# Patient Record
Sex: Male | Born: 1960 | Race: White | Hispanic: No | Marital: Married | Smoking: Never smoker
Health system: Southern US, Community
[De-identification: ages and names within clinical notes are randomized; demographics above are authoritative.]

## PROBLEM LIST (undated history)

## (undated) DIAGNOSIS — I10 Essential (primary) hypertension: Secondary | ICD-10-CM

## (undated) DIAGNOSIS — E119 Type 2 diabetes mellitus without complications: Secondary | ICD-10-CM

---

## 2009-11-09 ENCOUNTER — Emergency Department (HOSPITAL_COMMUNITY): Admission: EM | Admit: 2009-11-09 | Discharge: 2009-11-09 | Payer: Self-pay | Admitting: Emergency Medicine

## 2011-02-28 LAB — CBC
HCT: 48.7 % (ref 39.0–52.0)
Hemoglobin: 16.7 g/dL (ref 13.0–17.0)
MCHC: 34.3 g/dL (ref 30.0–36.0)
RDW: 12.5 % (ref 11.5–15.5)

## 2011-02-28 LAB — DIFFERENTIAL
Lymphs Abs: 0.3 10*3/uL — ABNORMAL LOW (ref 0.7–4.0)
Monocytes Relative: 4 % (ref 3–12)
Neutro Abs: 10.9 10*3/uL — ABNORMAL HIGH (ref 1.7–7.7)
Neutrophils Relative %: 93 % — ABNORMAL HIGH (ref 43–77)

## 2011-02-28 LAB — COMPREHENSIVE METABOLIC PANEL
Albumin: 4.2 g/dL (ref 3.5–5.2)
BUN: 21 mg/dL (ref 6–23)
Creatinine, Ser: 1.24 mg/dL (ref 0.4–1.5)
Potassium: 4.3 mEq/L (ref 3.5–5.1)
Total Protein: 7.6 g/dL (ref 6.0–8.3)

## 2011-02-28 LAB — POCT CARDIAC MARKERS
CKMB, poc: <1 ng/mL — ABNORMAL LOW (ref 1.0–8.0)
CKMB, poc: <1 ng/mL — ABNORMAL LOW (ref 1.0–8.0)
Myoglobin, poc: 129 ng/mL (ref 12–200)
Troponin i, poc: <0.05 ng/mL (ref 0.00–0.09)

## 2020-07-07 ENCOUNTER — Emergency Department (HOSPITAL_BASED_OUTPATIENT_CLINIC_OR_DEPARTMENT_OTHER): Payer: 59

## 2020-07-07 ENCOUNTER — Other Ambulatory Visit: Payer: Self-pay

## 2020-07-07 ENCOUNTER — Encounter (HOSPITAL_BASED_OUTPATIENT_CLINIC_OR_DEPARTMENT_OTHER): Payer: Self-pay

## 2020-07-07 ENCOUNTER — Inpatient Hospital Stay (HOSPITAL_BASED_OUTPATIENT_CLINIC_OR_DEPARTMENT_OTHER)
Admission: EM | Admit: 2020-07-07 | Discharge: 2020-07-08 | DRG: 871 | Disposition: A | Discharge status: Home / Self Care | Payer: 59 | Attending: Internal Medicine | Admitting: Internal Medicine

## 2020-07-07 DIAGNOSIS — R0902 Hypoxemia: Secondary | ICD-10-CM | POA: Diagnosis present

## 2020-07-07 DIAGNOSIS — J9601 Acute respiratory failure with hypoxia: Secondary | ICD-10-CM | POA: Diagnosis present

## 2020-07-07 DIAGNOSIS — I959 Hypotension, unspecified: Secondary | ICD-10-CM | POA: Diagnosis present

## 2020-07-07 DIAGNOSIS — R55 Syncope and collapse: Secondary | ICD-10-CM

## 2020-07-07 DIAGNOSIS — E119 Type 2 diabetes mellitus without complications: Secondary | ICD-10-CM | POA: Diagnosis present

## 2020-07-07 DIAGNOSIS — U071 COVID-19: Secondary | ICD-10-CM | POA: Diagnosis present

## 2020-07-07 DIAGNOSIS — E785 Hyperlipidemia, unspecified: Secondary | ICD-10-CM | POA: Diagnosis present

## 2020-07-07 DIAGNOSIS — Z79899 Other long term (current) drug therapy: Secondary | ICD-10-CM | POA: Diagnosis not present

## 2020-07-07 DIAGNOSIS — I10 Essential (primary) hypertension: Secondary | ICD-10-CM

## 2020-07-07 DIAGNOSIS — A4189 Other specified sepsis: Secondary | ICD-10-CM | POA: Diagnosis present

## 2020-07-07 DIAGNOSIS — J1282 Pneumonia due to coronavirus disease 2019: Secondary | ICD-10-CM | POA: Diagnosis present

## 2020-07-07 DIAGNOSIS — Z7982 Long term (current) use of aspirin: Secondary | ICD-10-CM

## 2020-07-07 HISTORY — DX: Type 2 diabetes mellitus without complications: E11.9

## 2020-07-07 HISTORY — DX: Essential (primary) hypertension: I10

## 2020-07-07 LAB — CBC WITH DIFFERENTIAL/PLATELET
Abs Immature Granulocytes: 0.01 10*3/uL (ref 0.00–0.07)
Basophils Absolute: 0 10*3/uL (ref 0.0–0.1)
Basophils Relative: 0 %
Eosinophils Absolute: 0 10*3/uL (ref 0.0–0.5)
Eosinophils Relative: 0 %
HCT: 46.5 % (ref 39.0–52.0)
Hemoglobin: 15.1 g/dL (ref 13.0–17.0)
Immature Granulocytes: 0 %
Lymphocytes Relative: 12 %
Lymphs Abs: 0.5 10*3/uL — ABNORMAL LOW (ref 0.7–4.0)
MCH: 29.8 pg (ref 26.0–34.0)
MCHC: 32.5 g/dL (ref 30.0–36.0)
MCV: 91.9 fL (ref 80.0–100.0)
Monocytes Absolute: 0.3 10*3/uL (ref 0.1–1.0)
Monocytes Relative: 7 %
Neutro Abs: 3.4 10*3/uL (ref 1.7–7.7)
Neutrophils Relative %: 81 %
Platelets: 136 10*3/uL — ABNORMAL LOW (ref 150–400)
RBC: 5.06 MIL/uL (ref 4.22–5.81)
RDW: 12.3 % (ref 11.5–15.5)
WBC: 4.2 10*3/uL (ref 4.0–10.5)
nRBC: 0 % (ref 0.0–0.2)

## 2020-07-07 LAB — COMPREHENSIVE METABOLIC PANEL
ALT: 22 U/L (ref 0–44)
AST: 40 U/L (ref 15–41)
Albumin: 3.4 g/dL — ABNORMAL LOW (ref 3.5–5.0)
Alkaline Phosphatase: 72 U/L (ref 38–126)
Anion gap: 12 (ref 5–15)
BUN: 16 mg/dL (ref 6–20)
CO2: 26 mmol/L (ref 22–32)
Calcium: 7.9 mg/dL — ABNORMAL LOW (ref 8.9–10.3)
Chloride: 97 mmol/L — ABNORMAL LOW (ref 98–111)
Creatinine, Ser: 1.21 mg/dL (ref 0.61–1.24)
GFR calc Af Amer: >60 mL/min (ref >60)
GFR calc non Af Amer: >60 mL/min (ref >60)
Glucose, Bld: 188 mg/dL — ABNORMAL HIGH (ref 70–99)
Potassium: 4 mmol/L (ref 3.5–5.1)
Sodium: 135 mmol/L (ref 135–145)
Total Bilirubin: 0.4 mg/dL (ref 0.3–1.2)
Total Protein: 6.7 g/dL (ref 6.5–8.1)

## 2020-07-07 LAB — GLUCOSE, CAPILLARY: Glucose-Capillary: 196 mg/dL — ABNORMAL HIGH (ref 70–99)

## 2020-07-07 LAB — PROTIME-INR
INR: 1 (ref 0.8–1.2)
Prothrombin Time: 12.3 seconds (ref 11.4–15.2)

## 2020-07-07 LAB — LACTIC ACID, PLASMA
Lactic Acid, Venous: 2.1 mmol/L (ref 0.5–1.9)
Lactic Acid, Venous: 2 mmol/L (ref 0.5–1.9)

## 2020-07-07 LAB — CREATININE, SERUM
Creatinine, Ser: 1.17 mg/dL (ref 0.61–1.24)
GFR calc Af Amer: >60 mL/min (ref >60)
GFR calc non Af Amer: >60 mL/min (ref >60)

## 2020-07-07 LAB — D-DIMER, QUANTITATIVE: D-Dimer, Quant: 0.79 ug/mL-FEU — ABNORMAL HIGH (ref 0.00–0.50)

## 2020-07-07 LAB — TRIGLYCERIDES: Triglycerides: 123 mg/dL (ref <150)

## 2020-07-07 LAB — SARS CORONAVIRUS 2 BY RT PCR (HOSPITAL ORDER, PERFORMED IN ~~LOC~~ HOSPITAL LAB): SARS Coronavirus 2: POSITIVE — AB

## 2020-07-07 LAB — PROCALCITONIN: Procalcitonin: <0.1 ng/mL

## 2020-07-07 LAB — TROPONIN I (HIGH SENSITIVITY): Troponin I (High Sensitivity): 9 ng/L (ref <18)

## 2020-07-07 LAB — FIBRINOGEN: Fibrinogen: 671 mg/dL — ABNORMAL HIGH (ref 210–475)

## 2020-07-07 LAB — APTT: aPTT: 28 seconds (ref 24–36)

## 2020-07-07 LAB — FERRITIN: Ferritin: 717 ng/mL — ABNORMAL HIGH (ref 24–336)

## 2020-07-07 LAB — C-REACTIVE PROTEIN: CRP: 5.5 mg/dL — ABNORMAL HIGH (ref <1.0)

## 2020-07-07 LAB — LACTATE DEHYDROGENASE: LDH: 322 U/L — ABNORMAL HIGH (ref 98–192)

## 2020-07-07 MED ORDER — VANCOMYCIN HCL IN DEXTROSE 1-5 GM/200ML-% IV SOLN
1000.0000 mg | Freq: Once | INTRAVENOUS | Status: AC
  Administered 2020-07-07: 1000 mg via INTRAVENOUS
  Filled 2020-07-07: qty 200

## 2020-07-07 MED ORDER — INSULIN ASPART 100 UNIT/ML ~~LOC~~ SOLN
0.0000 [IU] | Freq: Three times a day (TID) | SUBCUTANEOUS | Status: DC
  Administered 2020-07-08: 5 [IU] via SUBCUTANEOUS

## 2020-07-07 MED ORDER — ALBUTEROL SULFATE HFA 108 (90 BASE) MCG/ACT IN AERS
INHALATION_SPRAY | RESPIRATORY_TRACT | Status: AC
  Filled 2020-07-07: qty 6.7

## 2020-07-07 MED ORDER — SODIUM CHLORIDE 0.9 % IV SOLN
100.0000 mg | Freq: Once | INTRAVENOUS | Status: AC
  Administered 2020-07-07: 100 mg via INTRAVENOUS
  Filled 2020-07-07: qty 20

## 2020-07-07 MED ORDER — ACETAMINOPHEN 500 MG PO TABS
ORAL_TABLET | ORAL | Status: AC
  Administered 2020-07-07: 1000 mg via ORAL
  Filled 2020-07-07: qty 2

## 2020-07-07 MED ORDER — LACTATED RINGERS IV SOLN: INTRAVENOUS | Status: AC

## 2020-07-07 MED ORDER — SODIUM CHLORIDE 0.9 % IV SOLN: INTRAVENOUS | Status: DC | PRN

## 2020-07-07 MED ORDER — PANTOPRAZOLE SODIUM 40 MG IV SOLR
40.0000 mg | Freq: Every day | INTRAVENOUS | Status: DC
  Administered 2020-07-07: 40 mg via INTRAVENOUS
  Filled 2020-07-07: qty 40

## 2020-07-07 MED ORDER — INSULIN ASPART 100 UNIT/ML ~~LOC~~ SOLN: 0.0000 [IU] | Freq: Every day | SUBCUTANEOUS | Status: DC

## 2020-07-07 MED ORDER — ONDANSETRON HCL 4 MG/2ML IJ SOLN: 4.0000 mg | Freq: Four times a day (QID) | INTRAMUSCULAR | Status: DC | PRN

## 2020-07-07 MED ORDER — DEXAMETHASONE SODIUM PHOSPHATE 10 MG/ML IJ SOLN: 6.0000 mg | INTRAMUSCULAR | Status: DC

## 2020-07-07 MED ORDER — DEXAMETHASONE SODIUM PHOSPHATE 10 MG/ML IJ SOLN
6.0000 mg | Freq: Once | INTRAMUSCULAR | Status: AC
  Administered 2020-07-07: 6 mg via INTRAVENOUS
  Filled 2020-07-07: qty 1

## 2020-07-07 MED ORDER — IOHEXOL 350 MG/ML SOLN
100.0000 mL | Freq: Once | INTRAVENOUS | Status: AC | PRN
  Administered 2020-07-07: 100 mL via INTRAVENOUS

## 2020-07-07 MED ORDER — ACETAMINOPHEN 325 MG PO TABS: 650.0000 mg | ORAL_TABLET | Freq: Four times a day (QID) | ORAL | Status: DC | PRN

## 2020-07-07 MED ORDER — SODIUM CHLORIDE 0.9 % IV BOLUS
1000.0000 mL | Freq: Once | INTRAVENOUS | Status: AC
  Administered 2020-07-07: 1000 mL via INTRAVENOUS

## 2020-07-07 MED ORDER — ENOXAPARIN SODIUM 40 MG/0.4ML ~~LOC~~ SOLN
40.0000 mg | Freq: Every day | SUBCUTANEOUS | Status: DC
  Administered 2020-07-07: 40 mg via SUBCUTANEOUS
  Filled 2020-07-07: qty 0.4

## 2020-07-07 MED ORDER — METRONIDAZOLE IN NACL 5-0.79 MG/ML-% IV SOLN
500.0000 mg | Freq: Once | INTRAVENOUS | Status: AC
  Administered 2020-07-07: 500 mg via INTRAVENOUS
  Filled 2020-07-07: qty 100

## 2020-07-07 MED ORDER — SODIUM CHLORIDE 0.9 % IV SOLN
100.0000 mg | Freq: Every day | INTRAVENOUS | Status: DC
  Administered 2020-07-08: 100 mg via INTRAVENOUS
  Filled 2020-07-07: qty 20

## 2020-07-07 MED ORDER — SODIUM CHLORIDE 0.9 % IV SOLN
2.0000 g | Freq: Once | INTRAVENOUS | Status: AC
  Administered 2020-07-07: 2 g via INTRAVENOUS
  Filled 2020-07-07: qty 2

## 2020-07-07 MED ORDER — VANCOMYCIN HCL IN DEXTROSE 1-5 GM/200ML-% IV SOLN: 1000.0000 mg | Freq: Once | INTRAVENOUS | Status: DC

## 2020-07-07 MED ORDER — ONDANSETRON HCL 4 MG PO TABS: 4.0000 mg | ORAL_TABLET | Freq: Four times a day (QID) | ORAL | Status: DC | PRN

## 2020-07-07 MED ORDER — ACETAMINOPHEN 500 MG PO TABS: 1000.0000 mg | ORAL_TABLET | Freq: Once | ORAL | Status: AC

## 2020-07-07 NOTE — ED Provider Notes (Signed)
[facility] EMERGENCY DEPARTMENT Provider Note   CSN: [id number] Arrival date & time: 07/07/20  1531     History Chief Complaint  Patient presents with  . Loss of Consciousness    Jake Mosley is a 59 y.o. male.  The history is provided by the patient and medical records. The history is limited by the condition of the patient. No language interpreter was used.  Cough Cough characteristics:  Non-productive Severity:  Moderate Onset quality:  Gradual Duration:  1 week Timing:  Constant Progression:  Waxing and waning Chronicity:  New Relieved by:  Nothing Worsened by:  Nothing Ineffective treatments:  None tried Associated symptoms: chills, fever, myalgias and shortness of breath   Associated symptoms: no chest pain, no diaphoresis, no headaches, no rash, no rhinorrhea, no sore throat and no wheezing        Past Medical History:  Diagnosis Date  . Diabetes mellitus without complication (HCC)   . Hypertension     There are no problems to display for this patient.   History reviewed. No pertinent surgical history.     No family history on file.  Social History   Tobacco Use  . Smoking status: Never Smoker  . Smokeless tobacco: Never Used  Vaping Use  . Vaping Use: Never used  Substance Use Topics  . Alcohol use: Never  . Drug use: Never    Home Medications Prior to Admission medications   Not on File    Allergies    Patient has no known allergies.  Review of Systems   Review of Systems  Constitutional: Positive for chills, fatigue and fever. Negative for diaphoresis.  HENT: Negative for congestion, rhinorrhea and sore throat.   Eyes: Negative for photophobia.  Respiratory: Positive for cough, chest tightness and shortness of breath. Negative for wheezing.   Cardiovascular: Negative for chest pain, palpitations and leg swelling.  Gastrointestinal: Negative for abdominal pain, constipation, diarrhea, nausea and vomiting.    Genitourinary: Negative for flank pain.  Musculoskeletal: Positive for myalgias. Negative for back pain, neck pain and neck stiffness.  Skin: Negative for rash and wound.  Neurological: Positive for syncope and light-headedness. Negative for dizziness, weakness and headaches.  Psychiatric/Behavioral: Negative for agitation and confusion.  All other systems reviewed and are negative.   Physical Exam Updated Vital Signs BP (!) 85/61   Pulse 77   Temp (!) 100.5 F (38.1 C) (Oral)   Resp 14   SpO2 (!) 84%   Physical Exam Vitals and nursing note reviewed.  Constitutional:      General: He is not in acute distress.    Appearance: He is well-developed. He is ill-appearing. He is not toxic-appearing or diaphoretic.  HENT:     Head: Normocephalic and atraumatic.     Nose: Nose normal. No congestion or rhinorrhea.     Mouth/Throat:     Mouth: Mucous membranes are dry.     Pharynx: No oropharyngeal exudate or posterior oropharyngeal erythema.  Eyes:     Extraocular Movements: Extraocular movements intact.     Conjunctiva/sclera: Conjunctivae normal.     Pupils: Pupils are equal, round, and reactive to light.  Cardiovascular:     Rate and Rhythm: Normal rate and regular rhythm.     Pulses: Normal pulses.     Heart sounds: No murmur heard.   Pulmonary:     Effort: Pulmonary effort is normal. No respiratory distress.     Breath sounds: Rhonchi present. No wheezing or rales.  Chest:  Chest wall: No tenderness.  Abdominal:     General: Abdomen is flat.     Palpations: Abdomen is soft.     Tenderness: There is no abdominal tenderness. There is no right CVA tenderness, left CVA tenderness or guarding.     Hernia: No hernia is present.  Musculoskeletal:        General: No tenderness.     Cervical back: Neck supple. No tenderness.     Right lower leg: No edema.     Left lower leg: No edema.  Skin:    General: Skin is warm and dry.     Capillary Refill: Capillary refill takes  less than 2 seconds.     Coloration: Skin is not pale.     Findings: No erythema or rash.  Neurological:     General: No focal deficit present.     Mental Status: He is alert and oriented to person, place, and time.     Sensory: No sensory deficit.     Motor: No weakness.  Psychiatric:        Mood and Affect: Mood normal.     ED Results / Procedures / Treatments   Labs (all labs ordered are listed, but only abnormal results are displayed) Labs Reviewed  SARS CORONAVIRUS 2 BY RT PCR (HOSPITAL ORDER, PERFORMED IN [facility]) - Abnormal; Notable for the following components:      Result Value   SARS Coronavirus 2 POSITIVE (*)    All other components within normal limits  LACTIC ACID, PLASMA - Abnormal; Notable for the following components:   Lactic Acid, Venous 2.1 (*)    All other components within normal limits  COMPREHENSIVE METABOLIC PANEL - Abnormal; Notable for the following components:   Chloride 97 (*)    Glucose, Bld 188 (*)    Calcium 7.9 (*)    Albumin 3.4 (*)    All other components within normal limits  CBC WITH DIFFERENTIAL/PLATELET - Abnormal; Notable for the following components:   Platelets 136 (*)    Lymphs Abs 0.5 (*)    All other components within normal limits  LACTIC ACID, PLASMA - Abnormal; Notable for the following components:   Lactic Acid, Venous 2.0 (*)    All other components within normal limits  D-DIMER, QUANTITATIVE (NOT AT [facility]) - Abnormal; Notable for the following components:   D-Dimer, Quant 0.79 (*)    All other components within normal limits  LACTATE DEHYDROGENASE - Abnormal; Notable for the following components:   LDH 322 (*)    All other components within normal limits  FERRITIN - Abnormal; Notable for the following components:   Ferritin 717 (*)    All other components within normal limits  FIBRINOGEN - Abnormal; Notable for the following components:   Fibrinogen 671 (*)    All other components within normal limits    C-REACTIVE PROTEIN - Abnormal; Notable for the following components:   CRP 5.5 (*)    All other components within normal limits  GLUCOSE, CAPILLARY - Abnormal; Notable for the following components:   Glucose-Capillary 196 (*)    All other components within normal limits  CULTURE, BLOOD (ROUTINE X 2)  CULTURE, BLOOD (ROUTINE X 2)  URINE CULTURE  RESPIRATORY PANEL BY PCR  CULTURE, BLOOD (ROUTINE X 2)  CULTURE, BLOOD (ROUTINE X 2)  PROTIME-INR  APTT  PROCALCITONIN  TRIGLYCERIDES  URINALYSIS, ROUTINE W REFLEX MICROSCOPIC  LACTIC ACID, PLASMA  HIV ANTIBODY (ROUTINE TESTING W REFLEX)  CBC  CREATININE, SERUM  INFLUENZA PANEL BY PCR (TYPE A & B)  CBC WITH DIFFERENTIAL/PLATELET  COMPREHENSIVE METABOLIC PANEL  MAGNESIUM  HEMOGLOBIN A1C  ABO/RH  TROPONIN I (HIGH SENSITIVITY)  TROPONIN I (HIGH SENSITIVITY)    EKG EKG Interpretation  Date/Time:  Wednesday July 07 2020 15:47:39 EDT Ventricular Rate:  79 PR Interval:  138 QRS Duration: 76 QT Interval:  344 QTC Calculation: 394 R Axis:   -75 Text Interpretation: Normal sinus rhythm Left axis deviation Low voltage QRS Inferior infarct , age undetermined Possible Anterolateral infarct , age undetermined Abnormal ECG When compared to prior, similar apperance. No STEMI Confirmed by Theda Belfast ([id number]) on 07/07/2020 3:56:20 PM   Radiology CT Angio Chest PE W and/or Wo Contrast  Result Date: 07/07/2020 CLINICAL DATA:  Syncopal episode with flu like symptoms EXAM: CT ANGIOGRAPHY CHEST WITH CONTRAST TECHNIQUE: Multidetector CT imaging of the chest was performed using the standard protocol during bolus administration of intravenous contrast. Multiplanar CT image reconstructions and MIPs were obtained to evaluate the vascular anatomy. CONTRAST:  OMNIPAQUE IOHEXOL 350 MG/ML SOLN COMPARISON:  Chest x-ray 07/07/2020 FINDINGS: Cardiovascular: Suboptimal opacification of the pulmonary arteries to the segmental level. Limited evaluation  for distal segmental and subsegmental pulmonary emboli secondary to respiratory motion artifact as well as limited contrast opacification. No definite acute central embolus is seen. The aorta is nonaneurysmal. Coronary vascular calcification. Heart size within normal limits. No pericardial effusion Mediastinum/Nodes: Midline trachea. No thyroid mass. No suspicious adenopathy. Esophagus within normal limits. Lungs/Pleura: Moderate bilateral patchy airspace consolidations and ground-glass densities throughout the lungs. No pleural effusion or pneumothorax. Upper Abdomen: No acute abnormality. Musculoskeletal: No chest wall abnormality. No acute or significant osseous findings. Review of the MIP images confirms the above findings. IMPRESSION: 1. Limited study secondary to suboptimal opacification of the pulmonary arterial system. No definite acute central embolus is seen. 2. Moderate bilateral patchy airspace consolidations and ground-glass densities throughout the lungs, felt suspicious for multifocal pneumonia. Imaging features can be seen with COVID-19 pneumonia, though are nonspecific and can occur with a variety of infectious and noninfectious processes. Electronically Signed   By: Jasmine Pang M.D.   On: 07/07/2020 20:18   DG Chest Port 1 View  Result Date: 07/07/2020 CLINICAL DATA:  Sepsis.  Flu-like symptoms last week. EXAM: PORTABLE CHEST 1 VIEW COMPARISON:  Chest x-ray dated November 09, 2009. FINDINGS: The heart size and mediastinal contours are within normal limits. Normal pulmonary vascularity. Mild patchy peripheral asymmetric interstitial and airspace opacities in the right mid and both lower lungs. No pleural effusion or pneumothorax. No acute osseous abnormality. IMPRESSION: 1. Findings concerning for atypical viral pneumonia. Electronically Signed   By: Obie Dredge M.D.   On: 07/07/2020 17:32    Procedures Procedures (including critical care time)  CRITICAL CARE Performed by:  Canary Brim Charnel Giles Total critical care time: 45 minutes Critical care time was exclusive of separately billable procedures and treating other patients. Critical care was necessary to treat or prevent imminent or life-threatening deterioration. Critical care was time spent personally by me on the following activities: development of treatment plan with patient and/or surrogate as well as nursing, discussions with consultants, evaluation of patient's response to treatment, examination of patient, obtaining history from patient or surrogate, ordering and performing treatments and interventions, ordering and review of laboratory studies, ordering and review of radiographic studies, pulse oximetry and re-evaluation of patient's condition.  Saul Fabiano was evaluated in Emergency Department on 07/07/2020 for the symptoms described in the history  of present illness. He was evaluated in the context of the global COVID-19 pandemic, which necessitated consideration that the patient might be at risk for infection with the SARS-CoV-2 virus that causes COVID-19. Institutional protocols and algorithms that pertain to the evaluation of patients at risk for COVID-19 are in a state of rapid change based on information released by regulatory bodies including the CDC and federal and state organizations. These policies and algorithms were followed during the patient's care in the ED.    Medications Ordered in ED Medications  lactated ringers infusion ( Intravenous New Bag/Given 07/07/20 1826)  remdesivir 100 mg in sodium chloride 0.9 % 100 mL IVPB (0 mg Intravenous Stopped 07/07/20 1942)    Followed by  remdesivir 100 mg in sodium chloride 0.9 % 100 mL IVPB (0 mg Intravenous Stopped 07/07/20 2140)    Followed by  remdesivir 100 mg in sodium chloride 0.9 % 100 mL IVPB (has no administration in time range)  0.9 %  sodium chloride infusion ( Intravenous New Bag/Given 07/07/20 1901)  enoxaparin (LOVENOX) injection 40 mg  (40 mg Subcutaneous Given 07/07/20 2244)  pantoprazole (PROTONIX) injection 40 mg (40 mg Intravenous Given 07/07/20 2243)  acetaminophen (TYLENOL) tablet 650 mg (has no administration in time range)  ondansetron (ZOFRAN) tablet 4 mg (has no administration in time range)    Or  ondansetron (ZOFRAN) injection 4 mg (has no administration in time range)  dexamethasone (DECADRON) injection 6 mg (has no administration in time range)  insulin aspart (novoLOG) injection 0-5 Units (has no administration in time range)  insulin aspart (novoLOG) injection 0-15 Units (has no administration in time range)  albuterol (VENTOLIN HFA) 108 (90 Base) MCG/ACT inhaler (  Return to [facility] 07/07/20 1751)  ceFEPIme (MAXIPIME) 2 g in sodium chloride 0.9 % 100 mL IVPB (0 g Intravenous Stopped 07/07/20 1739)  metroNIDAZOLE (FLAGYL) IVPB 500 mg (0 mg Intravenous Stopped 07/07/20 1903)  vancomycin (VANCOCIN) IVPB 1000 mg/200 mL premix (0 mg Intravenous Stopped 07/07/20 1827)  sodium chloride 0.9 % bolus 1,000 mL (0 mLs Intravenous Stopped 07/07/20 1827)  acetaminophen (TYLENOL) tablet 1,000 mg (1,000 mg Oral Given 07/07/20 1644)  vancomycin (VANCOCIN) IVPB 1000 mg/200 mL premix (0 mg Intravenous Stopped 07/07/20 2011)  dexamethasone (DECADRON) injection 6 mg (6 mg Intravenous Given 07/07/20 1830)  iohexol (OMNIPAQUE) 350 MG/ML injection 100 mL (100 mLs Intravenous Contrast Given 07/07/20 1953)    ED Course  I have reviewed the triage vital signs and the nursing notes.  Pertinent labs & imaging results that were available during my care of the patient were reviewed by me and considered in my medical decision making (see chart for details).    MDM Rules/Calculators/A&P                          Zaahir Pickney is a 59 y.o. male with a past medical history significant for hypertension diabetes who presents with fevers, chills, cough, shortness of breath, syncope, and malaise.  He reports over the last week he has had URI-like  symptoms and got vaccinated for Covid recently.  He says that he has a Covid test that is pending from an outside facility but cannot see the results.  He reports that today, he passed out for several minutes after coming inside from sitting outside.  He reports that he did not fall and hit his head and was gently laid to the ground by his mother.  He reports he did not have  any loss of bowel or bladder control and did not have any shaking that would be seizure-like.  He denied any postictal.  He did not bite his tongue.  He denies any headache, neck pain, neck stiffness.  He denies any chest pain or palpitations.  He denies any abdominal pain.  He denied nausea, vomiting, constipation, or diarrhea.  He denies any known Covid contacts.   On arrival, patient is hypotensive with blood pressure of 60/40.  He was febrile 100.5 and on my initial evaluation, oxygen saturation is 84% on room air.  He was also tachypneic.  Given his vital signs, code sepsis was called with likely pneumonia as a source.  On exam, patient did have rhonchi but no wheezing.  No chest tenderness.  No murmur.  Legs nontender nonedematous.  Good pulses in extremities.  Abdomen and chest nontender.  Clinical aspect patient either has pneumonia causing sepsis versus Covid infection despite vaccination.  Given his hypotension and other vitals, we will make him a code sepsis although it may be viral with Covid.  We will check for Covid and give him fluids and antibiotics and labs.  He will be admitted for further management.  6:07 PM Covid test returned positive.  X-ray shows atypical viral pneumonia.  As patient was hypotensive we gave broad-spectrum antibiotics initially however now that we know it is Covid, will defer to admitting team for antibiotic continuation or discontinuation.  Patient did confirm that he had the first of his maternal shot last week before he started feeling bad.  He does say that his wife and his mother has had  similar URI symptoms.  Patient be admitted for new hypoxia in the setting of Covid.  Hospitalist requested we get a D-dimer and it was elevated.  PE study ordered and did not show pulmonary embolism.  Patient admitted for further management.  Final Clinical Impression(s) / ED Diagnoses Final diagnoses:  Hypoxia  Syncope, unspecified syncope type  COVID-19     Clinical Impression: 1. Hypoxia   2. Syncope, unspecified syncope type   3. COVID-19     Disposition: Admit  This note was prepared with assistance of Dragon voice recognition software. Occasional wrong-word or sound-a-like substitutions may have occurred due to the inherent limitations of voice recognition software.      Dannis Deroche, Canary Brim, MD 07/07/20 [phone]

## 2020-07-07 NOTE — ED Triage Notes (Signed)
Pt states he passed out ~3pm-denies pain-sates last week he had flu like sx last week-denies at present-states he has covid test pending-had covid vaccine 6/7

## 2020-07-07 NOTE — Care Plan (Signed)
59 year old gentleman with apparently no past medical history presented with shortness of breath.  He is currently requiring 4 L of oxygen.  Upon presentation, he was hypotensive required IV fluids.  Currently his blood pressure is 100/55 with map of 70.  According to ER physician, he is looking much better now.  Unfortunately he has been tested positive for COVID-19.  He had received 1 dose of Moderna vaccine about a week ago.  Typical inflammatory markers/infectious markers have not been done that we normally do for COVID-19.  I requested ED physician to do that including procalcitonin, CRP and D-dimer.  I requested to initiate Actemra if his CRP is high and he meets criteria.  I was informed that remdesivir has been ordered for patient.  Procalcitonin also pending.  If this is high then he will need antibiotics as well.  If D-dimer is elevated then he will need CT angiogram of the chest to rule out PE.  All of these have been communicated to the ED physician.  Patient currently has been accepted to stepdown unit at [facility] has Covid positive patient however I requested ED physician to watch him closely as if his blood pressure drops to the point that he requires vasopressors, he will need to be admitted to ICU under PCCM team.

## 2020-07-07 NOTE — ED Notes (Signed)
Pt to CT

## 2020-07-07 NOTE — H&P (Addendum)
History and Physical    Jake Mosley UTM:[id number] DOB: May 31, 1961 DOA: 07/07/2020  PCP: Elijio Miles., MD  Patient coming from: Home  I have personally briefly reviewed patient's old medical records in [facility]  Chief Complaint: Loss of consciousness  HPI: Jake Mosley is a 59 y.o. male with medical history significant of hypertension, hyperlipidemia and type 2 diabetes mellitus who presented to ED with a complaint of loss of consciousness.  According to patient, earlier today when he was walking towards his house from his yard, he suddenly felt dizzy and passed out.  It is unknown how long he was out but according to him, it was not more than a couple of minutes.  He was then brought into the emergency department.  He was found to be hypoxic with temperature of 100.5 and hypotensive.  Subsequently chest x-ray showed bilateral infiltrates and he was diagnosed with COVID-19 pneumonia.  Interestingly, patient does not have any respiratory complaints but when asked in detail, he endorses having some cough since last few days.  He also endorses that his wife as well as his mother both have respiratory symptoms.  He is not sure whether they have COVID-19 pneumonia or not.  This gentleman had his first chart of moderna vaccine just last week.  ED Course: Upon arrival to ED, he was hypotensive with temperature 100.5 and soon became hypoxic required 4 L of oxygen.  Chest x-ray showed bilateral infiltrates and he was tested positive for COVID-19 pneumonia.  He received multiple broad-spectrum antibiotics as well as 1 dose of remdesivir.  He was transferred from [facility] to [facility].  Review of Systems: As per HPI otherwise negative.    Past Medical History:  Diagnosis Date  . Diabetes mellitus without complication (HCC)   . Hypertension     History reviewed. No pertinent surgical history.   reports that he has never smoked. He has never used smokeless tobacco. He reports  that he does not drink alcohol and does not use drugs.  No Known Allergies  No family history on file.  Prior to Admission medications   Medication Sig Start Date End Date Taking? Authorizing Provider  aspirin 81 MG EC tablet Take by mouth.    [provider]  atorvastatin (LIPITOR) 80 MG tablet Take 80 mg by mouth daily. 06/16/20   [provider]  JARDIANCE 25 MG TABS tablet Take 25 mg by mouth daily. 06/16/20   [provider]  lisinopril (ZESTRIL) 40 MG tablet Take 40 mg by mouth daily. 05/16/20   [provider]  metFORMIN (GLUCOPHAGE-XR) 500 MG 24 hr tablet Take 1,000 mg by mouth 2 (two) times daily. 06/16/20   [provider]  metoprolol succinate (TOPROL-XL) 100 MG 24 hr tablet Take 100 mg by mouth daily. 06/16/20   [provider]  sildenafil (REVATIO) 20 MG tablet Take 20-60 mg by mouth daily as needed. 03/23/20   [provider]    Physical Exam: Vitals:   07/07/20 2030 07/07/20 2045 07/07/20 2126 07/07/20 2136  BP: 105/66 (!) 100/59 97/60   Pulse: 65 62 71   Resp: (!) 22 (!) 21 (!) 22   Temp:    99.5 F (37.5 C)  TempSrc:    Oral  SpO2: 94% 93% 95%   Weight:      Height:        Constitutional: NAD, calm, comfortable Vitals:   07/07/20 2030 07/07/20 2045 07/07/20 2126 07/07/20 2136  BP: 105/66 Marland Kitchen)  100/59 97/60   Pulse: 65 62 71   Resp: (!) 22 (!) 21 (!) 22   Temp:    99.5 F (37.5 C)  TempSrc:    Oral  SpO2: 94% 93% 95%   Weight:      Height:       Eyes: PERRL, lids and conjunctivae normal ENMT: Mucous membranes are moist. Posterior pharynx clear of any exudate or lesions.Normal dentition.  Neck: normal, supple, no masses, no thyromegaly Respiratory: clear to auscultation bilaterally, no wheezing, no crackles. Normal respiratory effort. No accessory muscle use.  Cardiovascular: Regular rate and rhythm, no murmurs / rubs / gallops. No extremity edema. 2+ pedal pulses. No carotid bruits.  Abdomen: no  tenderness, no masses palpated. No hepatosplenomegaly. Bowel sounds positive.  Musculoskeletal: no clubbing / cyanosis. No joint deformity upper and lower extremities. Good ROM, no contractures. Normal muscle tone.  Skin: no rashes, lesions, ulcers. No induration Neurologic: CN 2-12 grossly intact. Sensation intact, DTR normal. Strength 5/5 in all 4.  Psychiatric: Normal judgment and insight. Alert and oriented x 3. Normal mood.    Labs on Admission: I have personally reviewed following labs and imaging studies  CBC: Recent Labs  Lab 07/07/20 1638  WBC 4.2  NEUTROABS 3.4  HGB 15.1  HCT 46.5  MCV 91.9  PLT 136*   Basic Metabolic Panel: Recent Labs  Lab 07/07/20 1638  NA 135  K 4.0  CL 97*  CO2 26  GLUCOSE 188*  BUN 16  CREATININE 1.21  CALCIUM 7.9*   GFR: Estimated Creatinine Clearance: 81.6 mL/min (by C-G formula based on SCr of 1.21 mg/dL). Liver Function Tests: Recent Labs  Lab 07/07/20 1638  AST 40  ALT 22  ALKPHOS 72  BILITOT 0.4  PROT 6.7  ALBUMIN 3.4*   No results for input(s): LIPASE, AMYLASE in the last 168 hours. No results for input(s): AMMONIA in the last 168 hours. Coagulation Profile: Recent Labs  Lab 07/07/20 1638  INR 1.0   Cardiac Enzymes: No results for input(s): CKTOTAL, CKMB, CKMBINDEX, TROPONINI in the last 168 hours. BNP (last 3 results) No results for input(s): PROBNP in the last 8760 hours. HbA1C: No results for input(s): HGBA1C in the last 72 hours. CBG: Recent Labs  Lab 07/07/20 2216  GLUCAP 196*   Lipid Profile: Recent Labs    07/07/20 1638  TRIG 123   Thyroid Function Tests: No results for input(s): TSH, T4TOTAL, FREET4, T3FREE, THYROIDAB in the last 72 hours. Anemia Panel: Recent Labs    07/07/20 1638  FERRITIN 717*   Urine analysis: No results found for: COLORURINE, APPEARANCEUR, LABSPEC, PHURINE, GLUCOSEU, HGBUR, BILIRUBINUR, KETONESUR, PROTEINUR, UROBILINOGEN, NITRITE, LEUKOCYTESUR  Radiological Exams on  Admission: CT Angio Chest PE W and/or Wo Contrast  Result Date: 07/07/2020 CLINICAL DATA:  Syncopal episode with flu like symptoms EXAM: CT ANGIOGRAPHY CHEST WITH CONTRAST TECHNIQUE: Multidetector CT imaging of the chest was performed using the standard protocol during bolus administration of intravenous contrast. Multiplanar CT image reconstructions and MIPs were obtained to evaluate the vascular anatomy. CONTRAST:  OMNIPAQUE IOHEXOL 350 MG/ML SOLN COMPARISON:  Chest x-ray 07/07/2020 FINDINGS: Cardiovascular: Suboptimal opacification of the pulmonary arteries to the segmental level. Limited evaluation for distal segmental and subsegmental pulmonary emboli secondary to respiratory motion artifact as well as limited contrast opacification. No definite acute central embolus is seen. The aorta is nonaneurysmal. Coronary vascular calcification. Heart size within normal limits. No pericardial effusion Mediastinum/Nodes: Midline trachea. No thyroid mass. No suspicious adenopathy. Esophagus within  normal limits. Lungs/Pleura: Moderate bilateral patchy airspace consolidations and ground-glass densities throughout the lungs. No pleural effusion or pneumothorax. Upper Abdomen: No acute abnormality. Musculoskeletal: No chest wall abnormality. No acute or significant osseous findings. Review of the MIP images confirms the above findings. IMPRESSION: 1. Limited study secondary to suboptimal opacification of the pulmonary arterial system. No definite acute central embolus is seen. 2. Moderate bilateral patchy airspace consolidations and ground-glass densities throughout the lungs, felt suspicious for multifocal pneumonia. Imaging features can be seen with COVID-19 pneumonia, though are nonspecific and can occur with a variety of infectious and noninfectious processes. Electronically Signed   By: Jasmine PangKim  Fujinaga M.D.   On: 07/07/2020 20:18   DG Chest Port 1 View  Result Date: 07/07/2020 CLINICAL DATA:  Sepsis.  Flu-like  symptoms last week. EXAM: PORTABLE CHEST 1 VIEW COMPARISON:  Chest x-ray dated November 09, 2009. FINDINGS: The heart size and mediastinal contours are within normal limits. Normal pulmonary vascularity. Mild patchy peripheral asymmetric interstitial and airspace opacities in the right mid and both lower lungs. No pleural effusion or pneumothorax. No acute osseous abnormality. IMPRESSION: 1. Findings concerning for atypical viral pneumonia. Electronically Signed   By: Obie DredgeWilliam T Derry M.D.   On: 07/07/2020 17:32    EKG: Independently reviewed.  Normal sinus rhythm with left axis deviation.  No ST-T wave changes.  Assessment/Plan Active Problems:   Pneumonia due to COVID-19 virus   Syncope and collapse   Essential hypertension   Hyperlipidemia    Acute hypoxic respiratory failure secondary to COVID-19 pneumonia: Patient CRP is only 5.7 and although he is requiring 4 L of oxygen but patient is able to speak in complete sentences and he looks much better than how he looks on the chart.  He denied having any complaints.  We have started him on remdesivir as well as dexamethasone 6 mg daily for which he has received both of doses already in the ED.  No indication of Actemra at this point in time.  Continue treatment protocol for COVID-19 pneumonia per system policy.  D-dimer elevated but CT angiogram negative for PE.  Syncope: Likely vasovagal or hypoxia or hypotension in the field secondary to COVID-19 pneumonia.  Monitor on telemetry.  Check transthoracic echo for the sake of completeness.  History of hypertension but currently hypotensive: Patient is still remains borderline hypotensive.  Continue gentle hydration.  Continue to hold all antihypertensives.  Type 2 diabetes mellitus: Takes Jardiance and Metformin at home.  Hold both of them.  Start on SSI.  Check hemoglobin A1c.  Hyperlipidemia: Resume atorvastatin.  DVT prophylaxis: enoxaparin (LOVENOX) injection 40 mg Start: 07/07/20 2245 SCDs  Start: 07/07/20 2231 Code Status: Full code Family Communication: None present at bedside.  Plan of care discussed with patient in length and he verbalized understanding and agreed with it. Disposition Plan: Discharge when medically stable Consults called: None Admission status: Inpatient   Status is: Inpatient  Remains inpatient appropriate because:Inpatient level of care appropriate due to severity of illness   Dispo: The patient is from: Home              Anticipated d/c is to: Home              Anticipated d/c date is: 3 days              Patient currently is not medically stable to d/c.       Hughie Clossavi Oni Dietzman MD [facility]  07/07/2020, 10:53 PM  To contact the attending  provider between 7A-7P or the covering provider during after hours 7P-7A, please log into the web site [email or web address]

## 2020-07-07 NOTE — ED Notes (Signed)
Critical result received; lactic acid 2.0; Dr Tegeler aware.

## 2020-07-08 ENCOUNTER — Inpatient Hospital Stay (HOSPITAL_COMMUNITY): Payer: 59

## 2020-07-08 DIAGNOSIS — E785 Hyperlipidemia, unspecified: Secondary | ICD-10-CM

## 2020-07-08 DIAGNOSIS — R55 Syncope and collapse: Secondary | ICD-10-CM

## 2020-07-08 DIAGNOSIS — U071 COVID-19: Secondary | ICD-10-CM

## 2020-07-08 DIAGNOSIS — I10 Essential (primary) hypertension: Secondary | ICD-10-CM

## 2020-07-08 DIAGNOSIS — J1282 Pneumonia due to coronavirus disease 2019: Secondary | ICD-10-CM

## 2020-07-08 LAB — CBC
HCT: 42.7 % (ref 39.0–52.0)
Hemoglobin: 13.9 g/dL (ref 13.0–17.0)
MCH: 30.3 pg (ref 26.0–34.0)
MCHC: 32.6 g/dL (ref 30.0–36.0)
MCV: 93 fL (ref 80.0–100.0)
Platelets: 124 10*3/uL — ABNORMAL LOW (ref 150–400)
RBC: 4.59 MIL/uL (ref 4.22–5.81)
RDW: 12.5 % (ref 11.5–15.5)
WBC: 3.3 10*3/uL — ABNORMAL LOW (ref 4.0–10.5)
nRBC: 0 % (ref 0.0–0.2)

## 2020-07-08 LAB — COMPREHENSIVE METABOLIC PANEL
ALT: 22 U/L (ref 0–44)
AST: 32 U/L (ref 15–41)
Albumin: 3 g/dL — ABNORMAL LOW (ref 3.5–5.0)
Alkaline Phosphatase: 64 U/L (ref 38–126)
Anion gap: 8 (ref 5–15)
BUN: 19 mg/dL (ref 6–20)
CO2: 26 mmol/L (ref 22–32)
Calcium: 7.4 mg/dL — ABNORMAL LOW (ref 8.9–10.3)
Chloride: 102 mmol/L (ref 98–111)
Creatinine, Ser: 1.11 mg/dL (ref 0.61–1.24)
GFR calc Af Amer: >60 mL/min (ref >60)
GFR calc non Af Amer: >60 mL/min (ref >60)
Glucose, Bld: 183 mg/dL — ABNORMAL HIGH (ref 70–99)
Potassium: 4.5 mmol/L (ref 3.5–5.1)
Sodium: 136 mmol/L (ref 135–145)
Total Bilirubin: 0.5 mg/dL (ref 0.3–1.2)
Total Protein: 6.1 g/dL — ABNORMAL LOW (ref 6.5–8.1)

## 2020-07-08 LAB — RESPIRATORY PANEL BY PCR

## 2020-07-08 LAB — ABO/RH: ABO/RH(D): B POS

## 2020-07-08 LAB — URINALYSIS, ROUTINE W REFLEX MICROSCOPIC
Bacteria, UA: NONE SEEN
Bilirubin Urine: NEGATIVE
Glucose, UA: >=500 mg/dL — AB
Hgb urine dipstick: NEGATIVE
Ketones, ur: NEGATIVE mg/dL
Leukocytes,Ua: NEGATIVE
Nitrite: NEGATIVE
Protein, ur: NEGATIVE mg/dL
Specific Gravity, Urine: 1.033 — ABNORMAL HIGH (ref 1.005–1.030)
pH: 5 (ref 5.0–8.0)

## 2020-07-08 LAB — CBC WITH DIFFERENTIAL/PLATELET
Abs Immature Granulocytes: 0.01 10*3/uL (ref 0.00–0.07)
Basophils Absolute: 0 10*3/uL (ref 0.0–0.1)
Basophils Relative: 0 %
Eosinophils Absolute: 0 10*3/uL (ref 0.0–0.5)
Eosinophils Relative: 0 %
HCT: 43.7 % (ref 39.0–52.0)
Hemoglobin: 14.1 g/dL (ref 13.0–17.0)
Immature Granulocytes: 0 %
Lymphocytes Relative: 21 %
Lymphs Abs: 0.7 10*3/uL (ref 0.7–4.0)
MCH: 30.6 pg (ref 26.0–34.0)
MCHC: 32.3 g/dL (ref 30.0–36.0)
MCV: 94.8 fL (ref 80.0–100.0)
Monocytes Absolute: 0.2 10*3/uL (ref 0.1–1.0)
Monocytes Relative: 8 %
Neutro Abs: 2.2 10*3/uL (ref 1.7–7.7)
Neutrophils Relative %: 71 %
Platelets: 124 10*3/uL — ABNORMAL LOW (ref 150–400)
RBC: 4.61 MIL/uL (ref 4.22–5.81)
RDW: 12.5 % (ref 11.5–15.5)
WBC: 3.2 10*3/uL — ABNORMAL LOW (ref 4.0–10.5)
nRBC: 0 % (ref 0.0–0.2)

## 2020-07-08 LAB — HEMOGLOBIN A1C
Hgb A1c MFr Bld: 7.4 % — ABNORMAL HIGH (ref 4.8–5.6)
Mean Plasma Glucose: 165.68 mg/dL

## 2020-07-08 LAB — HIV ANTIBODY (ROUTINE TESTING W REFLEX): HIV Screen 4th Generation wRfx: NONREACTIVE

## 2020-07-08 LAB — INFLUENZA PANEL BY PCR (TYPE A & B)
Influenza A By PCR: NEGATIVE
Influenza B By PCR: NEGATIVE

## 2020-07-08 LAB — URINE CULTURE: Culture: NO GROWTH

## 2020-07-08 LAB — GLUCOSE, CAPILLARY
Glucose-Capillary: 119 mg/dL — ABNORMAL HIGH (ref 70–99)
Glucose-Capillary: 210 mg/dL — ABNORMAL HIGH (ref 70–99)

## 2020-07-08 LAB — MAGNESIUM: Magnesium: 2 mg/dL (ref 1.7–2.4)

## 2020-07-08 MED ORDER — EPINEPHRINE 0.3 MG/0.3ML IJ SOAJ
0.3000 mg | Freq: Once | INTRAMUSCULAR | Status: DC | PRN
Start: 2020-07-08 — End: 2020-07-08

## 2020-07-08 MED ORDER — SODIUM CHLORIDE 0.9 % IV SOLN
INTRAVENOUS | Status: DC | PRN
Start: 2020-07-08 — End: 2020-07-08

## 2020-07-08 MED ORDER — ENOXAPARIN SODIUM 60 MG/0.6ML ~~LOC~~ SOLN
60.0000 mg | Freq: Every day | SUBCUTANEOUS | Status: DC
  Filled 2020-07-08: qty 0.6

## 2020-07-08 MED ORDER — METHYLPREDNISOLONE SODIUM SUCC 125 MG IJ SOLR: 125.0000 mg | Freq: Once | INTRAMUSCULAR | Status: DC | PRN

## 2020-07-08 MED ORDER — SODIUM CHLORIDE 0.9 % IV SOLN: INTRAVENOUS | Status: DC | PRN

## 2020-07-08 MED ORDER — PREDNISONE 10 MG PO TABS
ORAL_TABLET | ORAL | 0 refills | Status: DC
Start: 2020-07-08 — End: 2020-07-22

## 2020-07-08 MED ORDER — EPINEPHRINE 0.3 MG/0.3ML IJ SOAJ: 0.3000 mg | Freq: Once | INTRAMUSCULAR | Status: DC | PRN

## 2020-07-08 MED ORDER — ORAL CARE MOUTH RINSE: 15.0000 mL | Freq: Two times a day (BID) | OROMUCOSAL | Status: DC

## 2020-07-08 MED ORDER — PROSOURCE PLUS PO LIQD: 30.0000 mL | Freq: Two times a day (BID) | ORAL | Status: DC

## 2020-07-08 MED ORDER — DIPHENHYDRAMINE HCL 50 MG/ML IJ SOLN: 50.0000 mg | Freq: Once | INTRAMUSCULAR | Status: DC | PRN

## 2020-07-08 MED ORDER — FAMOTIDINE IN NACL 20-0.9 MG/50ML-% IV SOLN: 20.0000 mg | Freq: Once | INTRAVENOUS | Status: DC | PRN

## 2020-07-08 MED ORDER — METHYLPREDNISOLONE SODIUM SUCC 125 MG IJ SOLR: 60.0000 mg | INTRAMUSCULAR | Status: DC

## 2020-07-08 MED ORDER — ALBUTEROL SULFATE HFA 108 (90 BASE) MCG/ACT IN AERS
2.0000 | INHALATION_SPRAY | Freq: Once | RESPIRATORY_TRACT | Status: DC | PRN
Start: 2020-07-08 — End: 2020-07-08

## 2020-07-08 MED ORDER — SODIUM CHLORIDE 0.9 % IV SOLN: 100.0000 mg | Freq: Once | INTRAVENOUS | Status: AC

## 2020-07-08 MED ORDER — ALBUTEROL SULFATE HFA 108 (90 BASE) MCG/ACT IN AERS: 2.0000 | INHALATION_SPRAY | Freq: Once | RESPIRATORY_TRACT | Status: DC | PRN

## 2020-07-08 MED ORDER — SODIUM CHLORIDE 0.9 % IV BOLUS
500.0000 mL | Freq: Once | INTRAVENOUS | Status: AC
  Administered 2020-07-08: 500 mL via INTRAVENOUS

## 2020-07-08 MED ORDER — FAMOTIDINE IN NACL 20-0.9 MG/50ML-% IV SOLN
20.0000 mg | Freq: Once | INTRAVENOUS | Status: DC | PRN
Start: 2020-07-08 — End: 2020-07-08

## 2020-07-08 MED ORDER — METHYLPREDNISOLONE SODIUM SUCC 125 MG IJ SOLR
60.0000 mg | Freq: Four times a day (QID) | INTRAMUSCULAR | Status: DC
  Administered 2020-07-08 (×2): 60 mg via INTRAVENOUS
  Filled 2020-07-08 (×2): qty 2

## 2020-07-08 MED ORDER — KATE FARMS STANDARD 1.4 PO LIQD
325.0000 mL | Freq: Every day | ORAL | Status: DC
  Filled 2020-07-08: qty 325

## 2020-07-08 MED ORDER — CHLORHEXIDINE GLUCONATE CLOTH 2 % EX PADS: 6.0000 | MEDICATED_PAD | Freq: Every day | CUTANEOUS | Status: DC

## 2020-07-08 MED ORDER — ADULT MULTIVITAMIN W/MINERALS CH: 1.0000 | ORAL_TABLET | Freq: Every day | ORAL | Status: DC

## 2020-07-08 NOTE — Progress Notes (Signed)
Patient's heart rate went down to 40's asymptomatic BP 108/76 map 84, EKG performed with bradycardia, CN aware, NP on called notified, 500 NS bolus to give.  With continue to monitor

## 2020-07-08 NOTE — Progress Notes (Signed)
  Echocardiogram 2D Echocardiogram has been performed.  Jake Mosley 07/08/2020, 2:50 PM

## 2020-07-08 NOTE — Progress Notes (Signed)
Initial Nutrition Assessment  DOCUMENTATION CODES:   Obesity unspecified  INTERVENTION:  - will order [employer] 1.4 po once/day, each supplement provides 455 kcal and 20 grams protein. - will order 30 mL Prosource Plus BID, each supplement provides 100 kcal and 15 grams of protein. - will order 1 tablet multivitamin with minerals/day.  NUTRITION DIAGNOSIS:   Increased nutrient needs related to acute illness, catabolic illness (COVID-19 infection) as evidenced by estimated needs  GOAL:   Patient will meet greater than or equal to 90% of their needs  MONITOR:   PO intake, Supplement acceptance, Labs, Weight trends  REASON FOR ASSESSMENT:   Malnutrition Screening Tool  ASSESSMENT:   59 y.o. male with medical history of HTN, hyperlipidemia, and type 2 DM. He presented to the ED after loss of consciousness. In the ED, he was found to be hypoxic, have a fever with temperature of 100.5, and was hypotensive. CXR showed bilateral infiltrates and he was diagnosed with COVID-19 PNA. He had first dose of Moderna vaccine the week PTA.  No intakes documented since admission. Weight yesterday was 259 lb. No other weight recording available in the chart.   Per notes: - COVID-19 PNA - syncopal episode   Labs reviewed; CBGs: 119 and 210 mg/dl, Ca: 7.4 mg/dl. Medications reviewed; sliding scale novolog, 60 mg solu-medrol QID, 100 mg IV remdesivir x2 doses 8/11, 100 mg IV remdesivir x1 dose/day (8/12-8/15).     NUTRITION - FOCUSED PHYSICAL EXAM:  unable to complete at this time.  Diet Order:   Diet Order            Diet Carb Modified Fluid consistency: Thin; Room service appropriate? Yes  Diet effective now                 EDUCATION NEEDS:   No education needs have been identified at this time  Skin:  Skin Assessment: Reviewed RN Assessment  Last BM:  8/12  Height:   Ht Readings from Last 1 Encounters:  07/07/20 5\' 7"  (1.702 m)    Weight:   Wt Readings from Last 1  Encounters:  07/07/20 117.7 kg    Estimated Nutritional Needs:  Kcal:  1800-2000 kcal Protein:  90-100 grams Fluid:  >/= 2 L/day     09/06/20, MS, RD, LDN, CNSC Inpatient Clinical Dietitian RD pager # available in AMION  After hours/weekend pager # available in [facility]

## 2020-07-08 NOTE — Progress Notes (Signed)
Patient scheduled for outpatient Remdesivir infusions at 12pm on Friday 8/13, Saturday 8/14, and Sunday 8/15 at [facility]. Please inform the patient to park at [address], [place], as staff will be escorting the patient through the east entrance of the hospital.    There is a wave flag banner located near the entrance on [address] Turn into this entrance and immediately turn left and park in 1 of the 5 designated Covid Infusion Parking spots. There is a phone number on the sign, please call and let the staff know what spot you are in and we will come out and get you. For questions call [phone].  Thanks.  * If patient is getting dropped off, you may have your ride pull up to the main entrance of [facility]. Please wait in the car and call [phone] to inform staff you have arrived. Staff will come out and meet the patient at the car and escort them through the hospital to the clinic.  

## 2020-07-08 NOTE — Progress Notes (Signed)
Pt ambulated in room on room air, unable to walk in the hall due to positive Covid status.  O2 sats on room air 92-94%, resp 22, HR 78.  Denies SOB does feels a bit tired while ambuating .

## 2020-07-08 NOTE — Discharge Instructions (Signed)
Patient scheduled for outpatient Remdesivir infusions at 12pm on Friday 8/13, Saturday 8/14, and Sunday 8/15 at [facility]. Please inform the patient to park at [address] [place], [place], as staff will be escorting the patient through the east entrance of the hospital.    There is a wave flag banner located near the entrance on [address]. Turn into this entrance and immediately turn left and park in 1 of the 5 designated Covid Infusion Parking spots. There is a phone number on the sign, please call and let the staff know what spot you are in and we will come out and get you. For questions call [phone].  Thanks.  * If patient is getting dropped off, you may have your ride pull up to the main entrance of [facility]. Please wait in the car and call [phone] to inform staff you have arrived. Staff will come out and meet the patient at the car and escort them through the hospital to the clinic.

## 2020-07-08 NOTE — Plan of Care (Signed)
  Problem: Nutrition: Goal: Adequate nutrition will be maintained Outcome: Progressing   

## 2020-07-08 NOTE — Discharge Summary (Signed)
Physician Discharge Summary  Jake Mosley NLG:[id number] DOB: [birth year] DOA: 07/07/2020  PCP: Elijio Miles., MD  Admit date: 07/07/2020 Discharge date: 07/08/2020  Admitted From: Home Disposition: Home  Recommendations for Outpatient Follow-up:  1. Follow up with PCP in 1-2 weeks 2. Please obtain BMP/CBC in one week  Home Health: None Equipment/Devices: None  Discharge Condition: Stable CODE STATUS: Full Diet recommendation: Regular diet as tolerated  Brief/Interim Summary: Jake Mosley is a 59 y.o. male with medical history significant of hypertension, hyperlipidemia and type 2 diabetes mellitus who presented to ED with a complaint of loss of consciousness.  According to patient, earlier today when he was walking towards his house from his yard, he suddenly felt dizzy and passed out.  It is unknown how long he was out but according to him, it was not more than a couple of minutes.  He was then brought into the emergency department.  He was found to be hypoxic with temperature of 100.5 and hypotensive.  Subsequently chest x-ray showed bilateral infiltrates and he was diagnosed with COVID-19 pneumonia.  Interestingly, patient does not have any respiratory complaints but when asked in detail, he endorses having some cough since last few days.  He also endorses that his wife as well as his mother both have respiratory symptoms.  He is not sure whether they have COVID-19 pneumonia or not.  This gentleman had his first chart of moderna vaccine just last week. Upon arrival to ED, he was hypotensive with temperature 100.5 and soon became hypoxic required 4 L of oxygen.  Chest x-ray showed bilateral infiltrates and he was tested positive for COVID-19 pneumonia.  He received multiple broad-spectrum antibiotics as well as 1 dose of remdesivir.  He was transferred from [facility] to [facility].  No acute issues or events overnight, patient feels markedly improved after initial treatment with  steroids and Remdesivir.  Patient was able to ambulate without hypoxia today, 91 to 94% without overt symptoms, still somewhat fatigued in general but otherwise feels back to baseline.  Patient will be discharged on remainder of steroid taper, Remdesivir to be scheduled at infusion center, otherwise close follow-up with PCP in the next 3 to 5 days for ongoing evaluation treatment and care.  Discharge Diagnoses:  Active Problems:   Pneumonia due to COVID-19 virus   Syncope and collapse   Essential hypertension   Hyperlipidemia    Discharge Instructions  Discharge Instructions    Call MD for:  difficulty breathing, headache or visual disturbances   Complete by: As directed    Call MD for:  extreme fatigue   Complete by: As directed    Call MD for:  temperature >100.4   Complete by: As directed    Discharge instructions   Complete by: As directed    ?   Person Under Monitoring Name: Jake Mosley  Location: [address] [place] [address]   Infection Prevention Recommendations for Individuals Confirmed to have, or Being Evaluated for, 2019 Novel Coronavirus (COVID-19) Infection Who Receive Care at Home  Individuals who are confirmed to have, or are being evaluated for, COVID-19 should follow the prevention steps below until a healthcare provider or local or state health department says they can return to normal activities.  Stay home except to get medical care You should restrict activities outside your home, except for getting medical care. Do not go to work, school, or public areas, and do not use public transportation or taxis.  Call ahead before  visiting your doctor Before your medical appointment, call the healthcare provider and tell them that you have, or are being evaluated for, COVID-19 infection. This will help the healthcare provider's office take steps to keep other people from getting infected. Ask your healthcare provider to call the local or state  health department.  Monitor your symptoms Seek prompt medical attention if your illness is worsening (e.g., difficulty breathing). Before going to your medical appointment, call the healthcare provider and tell them that you have, or are being evaluated for, COVID-19 infection. Ask your healthcare provider to call the local or state health department.  Wear a facemask You should wear a facemask that covers your nose and mouth when you are in the same room with other people and when you visit a healthcare provider. People who live with or visit you should also wear a facemask while they are in the same room with you.  Separate yourself from other people in your home As much as possible, you should stay in a different room from other people in your home. Also, you should use a separate bathroom, if available.  Avoid sharing household items You should not share dishes, drinking glasses, cups, eating utensils, towels, bedding, or other items with other people in your home. After using these items, you should wash them thoroughly with soap and water.  Cover your coughs and sneezes Cover your mouth and nose with a tissue when you cough or sneeze, or you can cough or sneeze into your sleeve. Throw used tissues in a lined trash can, and immediately wash your hands with soap and water for at least 20 seconds or use an alcohol-based hand rub.  Wash your [facility] your hands often and thoroughly with soap and water for at least 20 seconds. You can use an alcohol-based hand sanitizer if soap and water are not available and if your hands are not visibly dirty. Avoid touching your eyes, nose, and mouth with unwashed hands.   Prevention Steps for Caregivers and Household Members of Individuals Confirmed to have, or Being Evaluated for, COVID-19 Infection Being Cared for in the Home  If you live with, or provide care at home for, a person confirmed to have, or being evaluated for, COVID-19  infection please follow these guidelines to prevent infection:  Follow healthcare provider's instructions Make sure that you understand and can help the patient follow any healthcare provider instructions for all care.  Provide for the patient's basic needs You should help the patient with basic needs in the home and provide support for getting groceries, prescriptions, and other personal needs.  Monitor the patient's symptoms If they are getting sicker, call his or her medical provider and tell them that the patient has, or is being evaluated for, COVID-19 infection. This will help the healthcare provider's office take steps to keep other people from getting infected. Ask the healthcare provider to call the local or state health department.  Limit the number of people who have contact with the patient If possible, have only one caregiver for the patient. Other household members should stay in another home or place of residence. If this is not possible, they should stay in another room, or be separated from the patient as much as possible. Use a separate bathroom, if available. Restrict visitors who do not have an essential need to be in the home.  Keep older adults, very young children, and other sick people away from the patient Keep older adults, very young children, and  those who have compromised immune systems or chronic health conditions away from the patient. This includes people with chronic heart, lung, or kidney conditions, diabetes, and cancer.  Ensure good ventilation Make sure that shared spaces in the home have good air flow, such as from an air conditioner or an opened window, weather permitting.  Wash your hands often Wash your hands often and thoroughly with soap and water for at least 20 seconds. You can use an alcohol based hand sanitizer if soap and water are not available and if your hands are not visibly dirty. Avoid touching your eyes, nose, and mouth with  unwashed hands. Use disposable paper towels to dry your hands. If not available, use dedicated cloth towels and replace them when they become wet.  Wear a facemask and gloves Wear a disposable facemask at all times in the room and gloves when you touch or have contact with the patient's blood, body fluids, and/or secretions or excretions, such as sweat, saliva, sputum, nasal mucus, vomit, urine, or feces.  Ensure the mask fits over your nose and mouth tightly, and do not touch it during use. Throw out disposable facemasks and gloves after using them. Do not reuse. Wash your hands immediately after removing your facemask and gloves. If your personal clothing becomes contaminated, carefully remove clothing and launder. Wash your hands after handling contaminated clothing. Place all used disposable facemasks, gloves, and other waste in a lined container before disposing them with other household waste. Remove gloves and wash your hands immediately after handling these items.  Do not share dishes, glasses, or other household items with the patient Avoid sharing household items. You should not share dishes, drinking glasses, cups, eating utensils, towels, bedding, or other items with a patient who is confirmed to have, or being evaluated for, COVID-19 infection. After the person uses these items, you should wash them thoroughly with soap and water.  Wash laundry thoroughly Immediately remove and wash clothes or bedding that have blood, body fluids, and/or secretions or excretions, such as sweat, saliva, sputum, nasal mucus, vomit, urine, or feces, on them. Wear gloves when handling laundry from the patient. Read and follow directions on labels of laundry or clothing items and detergent. In general, wash and dry with the warmest temperatures recommended on the label.  Clean all areas the individual has used often Clean all touchable surfaces, such as counters, tabletops, doorknobs, bathroom fixtures,  toilets, phones, keyboards, tablets, and bedside tables, every day. Also, clean any surfaces that may have blood, body fluids, and/or secretions or excretions on them. Wear gloves when cleaning surfaces the patient has come in contact with. Use a diluted bleach solution (e.g., dilute bleach with 1 part bleach and 10 parts water) or a household disinfectant with a label that says EPA-registered for coronaviruses. To make a bleach solution at home, add 1 tablespoon of bleach to 1 quart (4 cups) of water. For a larger supply, add  cup of bleach to 1 gallon (16 cups) of water. Read labels of cleaning products and follow recommendations provided on product labels. Labels contain instructions for safe and effective use of the cleaning product including precautions you should take when applying the product, such as wearing gloves or eye protection and making sure you have good ventilation during use of the product. Remove gloves and wash hands immediately after cleaning.  Monitor yourself for signs and symptoms of illness Caregivers and household members are considered close contacts, should monitor their health, and will be asked to  limit movement outside of the home to the extent possible. Follow the monitoring steps for close contacts listed on the symptom monitoring form.   ? If you have additional questions, contact your local health department or call the epidemiologist on call at [phone] (available 24/7). ? This guidance is subject to change. For the most up-to-date guidance from CDC, please refer to their website: TripMetro.hu   Increase activity slowly   Complete by: As directed      Allergies as of 07/08/2020   No Known Allergies     Medication List    TAKE these medications   aspirin 81 MG EC tablet Take 81 mg by mouth daily.   atorvastatin 80 MG tablet Commonly known as: LIPITOR Take 80 mg by mouth daily.    Jardiance 25 MG Tabs tablet Generic drug: empagliflozin Take 25 mg by mouth daily.   lisinopril 40 MG tablet Commonly known as: ZESTRIL Take 40 mg by mouth daily.   metFORMIN 500 MG 24 hr tablet Commonly known as: GLUCOPHAGE-XR Take 1,000 mg by mouth 2 (two) times daily.   metoprolol succinate 100 MG 24 hr tablet Commonly known as: TOPROL-XL Take 100 mg by mouth daily.   predniSONE 10 MG tablet Commonly known as: DELTASONE Take 4 tablets (40 mg total) by mouth daily for 4 days, THEN 3 tablets (30 mg total) daily for 4 days, THEN 2 tablets (20 mg total) daily for 4 days, THEN 1 tablet (10 mg total) daily for 4 days. Start taking on: July 08, 2020   sildenafil 20 MG tablet Commonly known as: REVATIO Take 20-60 mg by mouth daily as needed.       No Known Allergies  Consultations:  None   Procedures/Studies: CT Angio Chest PE W and/or Wo Contrast  Result Date: 07/07/2020 CLINICAL DATA:  Syncopal episode with flu like symptoms EXAM: CT ANGIOGRAPHY CHEST WITH CONTRAST TECHNIQUE: Multidetector CT imaging of the chest was performed using the standard protocol during bolus administration of intravenous contrast. Multiplanar CT image reconstructions and MIPs were obtained to evaluate the vascular anatomy. CONTRAST:  OMNIPAQUE IOHEXOL 350 MG/ML SOLN COMPARISON:  Chest x-ray 07/07/2020 FINDINGS: Cardiovascular: Suboptimal opacification of the pulmonary arteries to the segmental level. Limited evaluation for distal segmental and subsegmental pulmonary emboli secondary to respiratory motion artifact as well as limited contrast opacification. No definite acute central embolus is seen. The aorta is nonaneurysmal. Coronary vascular calcification. Heart size within normal limits. No pericardial effusion Mediastinum/Nodes: Midline trachea. No thyroid mass. No suspicious adenopathy. Esophagus within normal limits. Lungs/Pleura: Moderate bilateral patchy airspace consolidations and  ground-glass densities throughout the lungs. No pleural effusion or pneumothorax. Upper Abdomen: No acute abnormality. Musculoskeletal: No chest wall abnormality. No acute or significant osseous findings. Review of the MIP images confirms the above findings. IMPRESSION: 1. Limited study secondary to suboptimal opacification of the pulmonary arterial system. No definite acute central embolus is seen. 2. Moderate bilateral patchy airspace consolidations and ground-glass densities throughout the lungs, felt suspicious for multifocal pneumonia. Imaging features can be seen with COVID-19 pneumonia, though are nonspecific and can occur with a variety of infectious and noninfectious processes. Electronically Signed   By: Jasmine Pang M.D.   On: 07/07/2020 20:18   DG Chest Port 1 View  Result Date: 07/07/2020 CLINICAL DATA:  Sepsis.  Flu-like symptoms last week. EXAM: PORTABLE CHEST 1 VIEW COMPARISON:  Chest x-ray dated November 09, 2009. FINDINGS: The heart size and mediastinal contours are within normal limits. Normal pulmonary vascularity. Mild  patchy peripheral asymmetric interstitial and airspace opacities in the right mid and both lower lungs. No pleural effusion or pneumothorax. No acute osseous abnormality. IMPRESSION: 1. Findings concerning for atypical viral pneumonia. Electronically Signed   By: Obie DredgeWilliam T Derry M.D.   On: 07/07/2020 17:32     Subjective: No acute issues or events overnight, denies nausea, vomiting, diarrhea, constipation, headache, fevers, chills, patient does report ongoing fatigue and minimal dyspnea with exertion but overall remarkably improved and essentially back to baseline.   Discharge Exam: Vitals:   07/08/20 1200 07/08/20 1342  BP:  122/69  Pulse:  (!) 59  Resp:  19  Temp: 98.2 F (36.8 C)   SpO2:  93%   Vitals:   07/08/20 1010 07/08/20 1012 07/08/20 1200 07/08/20 1342  BP:    122/69  Pulse: 63 74  (!) 59  Resp: (!) 26 (!) 27  19  Temp:   98.2 F (36.8 C)    TempSrc:   Oral   SpO2: 91% 92%  93%  Weight:      Height:        General: Pt is alert, awake, not in acute distress Cardiovascular: RRR, S1/S2 +, no rubs, no gallops Respiratory: Scant bilateral rhonchi without overt wheeze or rales. Abdominal: Soft, NT, ND, bowel sounds + Extremities: no edema, no cyanosis    The results of significant diagnostics from this hospitalization (including imaging, microbiology, ancillary and laboratory) are listed below for reference.     Microbiology: Recent Results (from the past 240 hour(s))  Blood Culture (routine x 2)     Status: None (Preliminary result)   Collection Time: 07/07/20  4:38 PM   Specimen: BLOOD  Result Value Ref Range Status   Specimen Description   Final    BLOOD RIGHT ANTECUBITAL Performed at [facility] [place], [address] [place], [place] [address]    Special Requests   Final    BOTTLES DRAWN AEROBIC AND ANAEROBIC Blood Culture adequate volume Performed at [facility] [place] [facility], [address] [place], [place] [address]    Culture   Final    NO GROWTH < 12 HOURS Performed at [facility], [address] [place], [place] [address]    Report Status PENDING  Incomplete  SARS Coronavirus 2 by RT PCR (hospital order, performed in [facility] lab) Nasopharyngeal Nasopharyngeal Swab     Status: Abnormal   Collection Time: 07/07/20  4:38 PM   Specimen: Nasopharyngeal Swab  Result Value Ref Range Status   SARS Coronavirus 2 POSITIVE (A) NEGATIVE Final    Comment: RESULT CALLED TO, READ BACK BY AND VERIFIED WITH: MARCUM RUTH RN AT 1740 ON 07/07/20 BY I.SUGUT (NOTE) SARS-CoV-2 target nucleic acids are DETECTED  SARS-CoV-2 RNA is generally detectable in upper respiratory specimens  during the acute phase of infection.  Positive results are indicative  of the presence of the identified virus, but do not rule out bacterial infection or co-infection with other pathogens not detected by the test.   Clinical correlation with patient history and  other diagnostic information is necessary to determine patient infection status.  The expected result is negative.  Fact Sheet for Patients:   BoilerBrush.com.cyhttps://www.fda.gov/media/136312/download   Fact Sheet for Healthcare Providers:   https://pope.com/https://www.fda.gov/media/136313/download    This test is not yet approved or cleared by the [place] FDA and  has been authorized for detection and/or diagnosis of SARS-CoV-2 by FDA under an Emergency Use Authorization (EUA).  This EUA  will remain in effect (meani ng this test can be used) for the duration of  the COVID-19 declaration under Section 564(b)(1) of the Act, 21 U.S.C. section 360-bbb-3(b)(1), unless the authorization is terminated or revoked sooner.  Performed at [facility], [address] [place], [place] [address]   Blood Culture (routine x 2)     Status: None (Preliminary result)   Collection Time: 07/07/20  4:58 PM   Specimen: BLOOD  Result Value Ref Range Status   Specimen Description   Final    BLOOD BLOOD RIGHT HAND Performed at [facility], [address] [place], [place] [address]    Special Requests   Final    BOTTLES DRAWN AEROBIC AND ANAEROBIC Blood Culture adequate volume Performed at [facility], [address] [place], [place] [address]    Culture   Final    NO GROWTH < 12 HOURS Performed at [facility], [address] [place], [place] [address]    Report Status PENDING  Incomplete  Respiratory Panel by PCR     Status: None   Collection Time: 07/07/20 10:32 PM   Specimen: Nasopharyngeal Swab; Respiratory  Result Value Ref Range Status   Adenovirus NOT DETECTED NOT DETECTED Final   Coronavirus 229E NOT DETECTED NOT DETECTED Final    Comment: (NOTE) The Coronavirus on the Respiratory Panel, DOES NOT test for the novel  Coronavirus (2019 nCoV)    Coronavirus HKU1 NOT DETECTED NOT DETECTED Final   Coronavirus NL63 NOT  DETECTED NOT DETECTED Final   Coronavirus OC43 NOT DETECTED NOT DETECTED Final   Metapneumovirus NOT DETECTED NOT DETECTED Final   Rhinovirus / Enterovirus NOT DETECTED NOT DETECTED Final   Influenza A NOT DETECTED NOT DETECTED Final   Influenza B NOT DETECTED NOT DETECTED Final   Parainfluenza Virus 1 NOT DETECTED NOT DETECTED Final   Parainfluenza Virus 2 NOT DETECTED NOT DETECTED Final   Parainfluenza Virus 3 NOT DETECTED NOT DETECTED Final   Parainfluenza Virus 4 NOT DETECTED NOT DETECTED Final   Respiratory Syncytial Virus NOT DETECTED NOT DETECTED Final   Bordetella pertussis NOT DETECTED NOT DETECTED Final   Chlamydophila pneumoniae NOT DETECTED NOT DETECTED Final   Mycoplasma pneumoniae NOT DETECTED NOT DETECTED Final    Comment: Performed at [facility], [address] [place], [place] [address]     Labs: BNP (last 3 results) No results for input(s): BNP in the last 8760 hours. Basic Metabolic Panel: Recent Labs  Lab 07/07/20 1638 07/07/20 2313 07/08/20 0243  NA 135  --  136  K 4.0  --  4.5  CL 97*  --  102  CO2 26  --  26  GLUCOSE 188*  --  183*  BUN 16  --  19  CREATININE 1.21 1.17 1.11  CALCIUM 7.9*  --  7.4*  MG  --   --  2.0   Liver Function Tests: Recent Labs  Lab 07/07/20 1638 07/08/20 0243  AST 40 32  ALT 22 22  ALKPHOS 72 64  BILITOT 0.4 0.5  PROT 6.7 6.1*  ALBUMIN 3.4* 3.0*   No results for input(s): LIPASE, AMYLASE in the last 168 hours. No results for input(s): AMMONIA in the last 168 hours. CBC: Recent Labs  Lab 07/07/20 1638 07/07/20 2313 07/08/20 0243  WBC 4.2 3.3* 3.2*  NEUTROABS 3.4  --  2.2  HGB 15.1 13.9 14.1  HCT 46.5 42.7 43.7  MCV  91.9 93.0 94.8  PLT 136* 124* 124*   Cardiac Enzymes: No results for input(s): CKTOTAL, CKMB, CKMBINDEX, TROPONINI in the last 168 hours. BNP: Invalid input(s): POCBNP CBG: Recent Labs  Lab 07/07/20 2216 07/08/20 0820 07/08/20 1152  GLUCAP 196* 119* 210*   D-Dimer Recent Labs     07/07/20 1638  DDIMER 0.79*   Hgb A1c Recent Labs    07/07/20 2259  HGBA1C 7.4*   Lipid Profile Recent Labs    07/07/20 1638  TRIG 123   Thyroid function studies No results for input(s): TSH, T4TOTAL, T3FREE, THYROIDAB in the last 72 hours.  Invalid input(s): FREET3 Anemia work up Recent Labs    07/07/20 1638  FERRITIN 717*   Urinalysis    Component Value Date/Time   COLORURINE YELLOW 07/08/2020 0217   APPEARANCEUR CLEAR 07/08/2020 0217   LABSPEC 1.033 (H) 07/08/2020 0217   PHURINE 5.0 07/08/2020 0217   GLUCOSEU >=500 (A) 07/08/2020 0217   HGBUR NEGATIVE 07/08/2020 0217   BILIRUBINUR NEGATIVE 07/08/2020 0217   KETONESUR NEGATIVE 07/08/2020 0217   PROTEINUR NEGATIVE 07/08/2020 0217   NITRITE NEGATIVE 07/08/2020 0217   LEUKOCYTESUR NEGATIVE 07/08/2020 0217   Sepsis Labs Invalid input(s): PROCALCITONIN,  WBC,  LACTICIDVEN Microbiology Recent Results (from the past 240 hour(s))  Blood Culture (routine x 2)     Status: None (Preliminary result)   Collection Time: 07/07/20  4:38 PM   Specimen: BLOOD  Result Value Ref Range Status   Specimen Description   Final    BLOOD RIGHT ANTECUBITAL Performed at [facility], [address] [place], [place] [address]    Special Requests   Final    BOTTLES DRAWN AEROBIC AND ANAEROBIC Blood Culture adequate volume Performed at [facility], [address] [place], [place] [address]    Culture   Final    NO GROWTH < 12 HOURS Performed at [facility], [address], [place], [place] [address]    Report Status PENDING  Incomplete  SARS Coronavirus 2 by RT PCR (hospital order, performed in [facility] hospital lab) Nasopharyngeal Nasopharyngeal Swab     Status: Abnormal   Collection Time: 07/07/20  4:38 PM   Specimen: Nasopharyngeal Swab  Result Value Ref Range Status   SARS Coronavirus 2 POSITIVE (A) NEGATIVE Final    Comment: RESULT CALLED TO, READ BACK BY AND VERIFIED WITH: MARCUM RUTH  RN AT 1740 ON 07/07/20 BY I.SUGUT (NOTE) SARS-CoV-2 target nucleic acids are DETECTED  SARS-CoV-2 RNA is generally detectable in upper respiratory specimens  during the acute phase of infection.  Positive results are indicative  of the presence of the identified virus, but do not rule out bacterial infection or co-infection with other pathogens not detected by the test.  Clinical correlation with patient history and  other diagnostic information is necessary to determine patient infection status.  The expected result is negative.  Fact Sheet for Patients:   [employer]   Fact Sheet for Healthcare Providers:   https://pope.com/    This test is not yet approved or cleared by the [place] FDA and  has been authorized for detection and/or diagnosis of SARS-CoV-2 by FDA under an Emergency Use Authorization (EUA).  This EUA will remain in effect (meani ng this test can be used) for the duration of  the COVID-19 declaration under Section 564(b)(1) of the Act, 21 U.S.C. section 360-bbb-3(b)(1), unless the authorization is terminated or revoked sooner.  Performed at [facility],  [address] [place], [place] [address]   Blood Culture (routine x 2)     Status: None (Preliminary result)   Collection Time: 07/07/20  4:58 PM   Specimen: BLOOD  Result Value Ref Range Status   Specimen Description   Final    BLOOD BLOOD RIGHT HAND Performed at [facility], [address] [place], [place] [address]    Special Requests   Final    BOTTLES DRAWN AEROBIC AND ANAEROBIC Blood Culture adequate volume Performed at [facility], [address] [place], [place] [address]    Culture   Final    NO GROWTH < 12 HOURS Performed at [facility], [address], [place], [place] [address]    Report Status PENDING  Incomplete  Respiratory Panel by PCR     Status: None   Collection Time: 07/07/20 10:32  PM   Specimen: Nasopharyngeal Swab; Respiratory  Result Value Ref Range Status   Adenovirus NOT DETECTED NOT DETECTED Final   Coronavirus 229E NOT DETECTED NOT DETECTED Final    Comment: (NOTE) The Coronavirus on the Respiratory Panel, DOES NOT test for the novel  Coronavirus (2019 nCoV)    Coronavirus HKU1 NOT DETECTED NOT DETECTED Final   Coronavirus NL63 NOT DETECTED NOT DETECTED Final   Coronavirus OC43 NOT DETECTED NOT DETECTED Final   Metapneumovirus NOT DETECTED NOT DETECTED Final   Rhinovirus / Enterovirus NOT DETECTED NOT DETECTED Final   Influenza A NOT DETECTED NOT DETECTED Final   Influenza B NOT DETECTED NOT DETECTED Final   Parainfluenza Virus 1 NOT DETECTED NOT DETECTED Final   Parainfluenza Virus 2 NOT DETECTED NOT DETECTED Final   Parainfluenza Virus 3 NOT DETECTED NOT DETECTED Final   Parainfluenza Virus 4 NOT DETECTED NOT DETECTED Final   Respiratory Syncytial Virus NOT DETECTED NOT DETECTED Final   Bordetella pertussis NOT DETECTED NOT DETECTED Final   Chlamydophila pneumoniae NOT DETECTED NOT DETECTED Final   Mycoplasma pneumoniae NOT DETECTED NOT DETECTED Final    Comment: Performed at [facility], [address] [place], [place] [address]     Time coordinating discharge: Over 30 minutes  SIGNED:   Azucena Fallen, DO [facility] 07/08/2020, 2:21 PM Pager   If 7PM-7AM, please contact night-coverage [email or web address]

## 2020-07-08 NOTE — TOC Initial Note (Signed)
Transition of Care [facility]) - Initial/Assessment Note    Patient Details  Name: Jake Mosley MRN: [id number] Date of Birth: 26-Jun-1961  Transition of Care [facility]) CM/SW Contact:    Golda Acre, RN Phone Number: 07/08/2020, 1:52 PM  Clinical Narrative:                 Positive for covid/unvaccinated, other family members at home have symptoms but have sought medical care at this point, pt passed out in his yard came to the ed and tested +. Iv solumedrol and iv remdesivir started. Following for progression and toc needs. Expected Discharge Plan: Home/Self Care Barriers to Discharge: Continued Medical Work up   Patient Goals and CMS Choice Patient states their goals for this hospitalization and ongoing recovery are:: to go home and get over this CMS Medicare.gov Compare Post Acute Care list provided to:: Patient    Expected Discharge Plan and Services Expected Discharge Plan: Home/Self Care   Discharge Planning Services: CM Consult   Living arrangements for the past 2 months: Single Family Home                                      Prior Living Arrangements/Services Living arrangements for the past 2 months: Single Family Home Lives with:: Spouse Patient language and need for interpreter reviewed:: Yes Do you feel safe going back to the place where you live?: Yes      Need for Family Participation in Patient Care: Yes (Comment) Care giver support system in place?: Yes (comment)   Criminal Activity/Legal Involvement Pertinent to Current Situation/Hospitalization: No - Comment as needed  Activities of Daily Living Home Assistive Devices/Equipment: None ADL Screening (condition at time of admission) Patient's cognitive ability adequate to safely complete daily activities?: Yes Is the patient deaf or have difficulty hearing?: No Does the patient have difficulty seeing, even when wearing glasses/contacts?: No Does the patient have difficulty concentrating,  remembering, or making decisions?: No Patient able to express need for assistance with ADLs?: Yes Does the patient have difficulty dressing or bathing?: No Independently performs ADLs?: Yes (appropriate for developmental age) Does the patient have difficulty walking or climbing stairs?: No Weakness of Legs: None Weakness of Arms/Hands: None  Permission Sought/Granted                  Emotional Assessment Appearance:: Appears stated age     Orientation: : Oriented to Self, Oriented to  Time, Oriented to Situation, Oriented to Place Alcohol / Substance Use: Not Applicable Psych Involvement: No (comment)  Admission diagnosis:  Hypoxia [R09.02] Syncope, unspecified syncope type [R55] Pneumonia due to COVID-19 virus [U07.1, J12.82] COVID-19 [U07.1] Patient Active Problem List   Diagnosis Date Noted  . Pneumonia due to COVID-19 virus 07/07/2020  . Syncope and collapse 07/07/2020  . Essential hypertension 07/07/2020  . Hyperlipidemia 07/07/2020   PCP:  Elijio Miles., MD Pharmacy:   [facility] [phone] - [address], [place] - [address] AT [facility] [address] [place] [address] Phone: [phone] Fax: [phone]     Social Determinants of Health (SDOH) Interventions    Readmission Risk Interventions No flowsheet data found.

## 2020-07-09 ENCOUNTER — Ambulatory Visit (HOSPITAL_COMMUNITY)
Admission: RE | Admit: 2020-07-09 | Discharge: 2020-07-09 | Disposition: A | Discharge status: Home / Self Care | Payer: 59 | Source: Ambulatory Visit | Attending: Pulmonary Disease | Admitting: Pulmonary Disease

## 2020-07-09 DIAGNOSIS — J1282 Pneumonia due to Coronavirus disease 2019: Secondary | ICD-10-CM | POA: Insufficient documentation

## 2020-07-09 DIAGNOSIS — U071 COVID-19: Secondary | ICD-10-CM | POA: Insufficient documentation

## 2020-07-09 DIAGNOSIS — R0902 Hypoxemia: Secondary | ICD-10-CM | POA: Diagnosis not present

## 2020-07-09 MED ORDER — SODIUM CHLORIDE 0.9 % IV SOLN: 100.0000 mg | Freq: Once | INTRAVENOUS | Status: DC

## 2020-07-09 MED ORDER — EPINEPHRINE 0.3 MG/0.3ML IJ SOAJ: 0.3000 mg | Freq: Once | INTRAMUSCULAR | Status: DC | PRN

## 2020-07-09 MED ORDER — METHYLPREDNISOLONE SODIUM SUCC 125 MG IJ SOLR: 125.0000 mg | Freq: Once | INTRAMUSCULAR | Status: DC | PRN

## 2020-07-09 MED ORDER — SODIUM CHLORIDE 0.9 % IV SOLN
100.0000 mg | Freq: Once | INTRAVENOUS | Status: AC
  Administered 2020-07-09: 100 mg via INTRAVENOUS
  Filled 2020-07-09: qty 20

## 2020-07-09 MED ORDER — SODIUM CHLORIDE 0.9 % IV SOLN
INTRAVENOUS | Status: DC | PRN
Start: 2020-07-09 — End: 2020-07-09

## 2020-07-09 MED ORDER — DIPHENHYDRAMINE HCL 50 MG/ML IJ SOLN: 50.0000 mg | Freq: Once | INTRAMUSCULAR | Status: DC | PRN

## 2020-07-09 MED ORDER — REMDESIVIR 100 MG IV SOLR
100.0000 mg | Freq: Once | INTRAVENOUS | Status: DC
Start: 2020-07-09 — End: 2020-07-09

## 2020-07-09 MED ORDER — DEXTROSE 50 % IV SOLN
INTRAVENOUS | Status: AC
  Filled 2020-07-09: qty 50

## 2020-07-09 MED ORDER — ALBUTEROL SULFATE HFA 108 (90 BASE) MCG/ACT IN AERS: 2.0000 | INHALATION_SPRAY | Freq: Once | RESPIRATORY_TRACT | Status: DC | PRN

## 2020-07-09 MED ORDER — ALBUTEROL SULFATE HFA 108 (90 BASE) MCG/ACT IN AERS
2.0000 | INHALATION_SPRAY | Freq: Once | RESPIRATORY_TRACT | Status: DC | PRN
Start: 2020-07-09 — End: 2020-07-09

## 2020-07-09 MED ORDER — FAMOTIDINE IN NACL 20-0.9 MG/50ML-% IV SOLN: 20.0000 mg | Freq: Once | INTRAVENOUS | Status: DC | PRN

## 2020-07-09 MED ORDER — SODIUM CHLORIDE 0.9 % IV SOLN: INTRAVENOUS | Status: DC | PRN

## 2020-07-09 NOTE — Progress Notes (Signed)
  Diagnosis: COVID-19  Physician:Dr Wright  Procedure: Covid Infusion Clinic Med: remdesivir infusion - Provided patient with remdesivir fact sheet for patients, parents and caregivers prior to infusion.  Complications: No immediate complications noted.  Discharge: Discharged home   Jake Mosley 07/09/2020  

## 2020-07-09 NOTE — Discharge Instructions (Signed)
10 Things You Can Do to Manage Your COVID-19 Symptoms at Home If you have possible or confirmed COVID-19: 1. Stay home from work and school. And stay away from other public places. If you must go out, avoid using any kind of public transportation, ridesharing, or taxis. 2. Monitor your symptoms carefully. If your symptoms get worse, call your healthcare provider immediately. 3. Get rest and stay hydrated. 4. If you have a medical appointment, call the healthcare provider ahead of time and tell them that you have or may have COVID-19. 5. For medical emergencies, call 911 and notify the dispatch personnel that you have or may have COVID-19. 6. Cover your cough and sneezes with a tissue or use the inside of your elbow. 7. Wash your hands often with soap and water for at least 20 seconds or clean your hands with an alcohol-based hand sanitizer that contains at least 60% alcohol. 8. As much as possible, stay in a specific room and away from other people in your home. Also, you should use a separate bathroom, if available. If you need to be around other people in or outside of the home, wear a mask. 9. Avoid sharing personal items with other people in your household, like dishes, towels, and bedding. 10. Clean all surfaces that are touched often, like counters, tabletops, and doorknobs. Use household cleaning sprays or wipes according to the label instructions. [email or web address] 05/28/2019 This information is not intended to replace advice given to you by your health care provider. Make sure you discuss any questions you have with your health care provider. Document Revised: 10/30/2019 Document Reviewed: 10/30/2019 Elsevier Patient Education  2020 [employer]. Mr.

## 2020-07-10 ENCOUNTER — Encounter (HOSPITAL_COMMUNITY): Payer: Self-pay

## 2020-07-10 ENCOUNTER — Other Ambulatory Visit: Payer: Self-pay

## 2020-07-10 ENCOUNTER — Inpatient Hospital Stay (HOSPITAL_COMMUNITY)
Admission: EM | Admit: 2020-07-10 | Discharge: 2020-07-22 | DRG: 177 | Disposition: A | Discharge status: Home / Self Care | Payer: 59 | Attending: Internal Medicine | Admitting: Internal Medicine

## 2020-07-10 ENCOUNTER — Emergency Department (HOSPITAL_COMMUNITY): Payer: 59

## 2020-07-10 ENCOUNTER — Ambulatory Visit (HOSPITAL_COMMUNITY)
Admission: RE | Admit: 2020-07-10 | Discharge: 2020-07-10 | Disposition: A | Discharge status: Home / Self Care | Payer: 59 | Source: Ambulatory Visit | Attending: Pulmonary Disease | Admitting: Pulmonary Disease

## 2020-07-10 DIAGNOSIS — J1282 Pneumonia due to coronavirus disease 2019: Secondary | ICD-10-CM | POA: Diagnosis present

## 2020-07-10 DIAGNOSIS — K047 Periapical abscess without sinus: Secondary | ICD-10-CM | POA: Diagnosis present

## 2020-07-10 DIAGNOSIS — U071 COVID-19: Secondary | ICD-10-CM | POA: Insufficient documentation

## 2020-07-10 DIAGNOSIS — Z7982 Long term (current) use of aspirin: Secondary | ICD-10-CM

## 2020-07-10 DIAGNOSIS — I10 Essential (primary) hypertension: Secondary | ICD-10-CM | POA: Diagnosis present

## 2020-07-10 DIAGNOSIS — Z79899 Other long term (current) drug therapy: Secondary | ICD-10-CM

## 2020-07-10 DIAGNOSIS — J9601 Acute respiratory failure with hypoxia: Secondary | ICD-10-CM | POA: Diagnosis present

## 2020-07-10 DIAGNOSIS — E785 Hyperlipidemia, unspecified: Secondary | ICD-10-CM | POA: Diagnosis present

## 2020-07-10 DIAGNOSIS — Z6841 Body Mass Index (BMI) 40.0 and over, adult: Secondary | ICD-10-CM

## 2020-07-10 DIAGNOSIS — R0902 Hypoxemia: Secondary | ICD-10-CM | POA: Diagnosis present

## 2020-07-10 DIAGNOSIS — E669 Obesity, unspecified: Secondary | ICD-10-CM | POA: Diagnosis present

## 2020-07-10 DIAGNOSIS — K029 Dental caries, unspecified: Secondary | ICD-10-CM | POA: Diagnosis present

## 2020-07-10 DIAGNOSIS — E119 Type 2 diabetes mellitus without complications: Secondary | ICD-10-CM

## 2020-07-10 DIAGNOSIS — Z7984 Long term (current) use of oral hypoglycemic drugs: Secondary | ICD-10-CM

## 2020-07-10 DIAGNOSIS — E1165 Type 2 diabetes mellitus with hyperglycemia: Secondary | ICD-10-CM | POA: Diagnosis present

## 2020-07-10 LAB — CBC WITH DIFFERENTIAL/PLATELET
Abs Immature Granulocytes: 0.08 10*3/uL — ABNORMAL HIGH (ref 0.00–0.07)
Basophils Absolute: 0 10*3/uL (ref 0.0–0.1)
Basophils Relative: 0 %
Eosinophils Absolute: 0 10*3/uL (ref 0.0–0.5)
Eosinophils Relative: 0 %
HCT: 44.9 % (ref 39.0–52.0)
Hemoglobin: 14.9 g/dL (ref 13.0–17.0)
Immature Granulocytes: 1 %
Lymphocytes Relative: 6 %
Lymphs Abs: 0.5 10*3/uL — ABNORMAL LOW (ref 0.7–4.0)
MCH: 30.2 pg (ref 26.0–34.0)
MCHC: 33.2 g/dL (ref 30.0–36.0)
MCV: 91.1 fL (ref 80.0–100.0)
Monocytes Absolute: 0.5 10*3/uL (ref 0.1–1.0)
Monocytes Relative: 6 %
Neutro Abs: 6.7 10*3/uL (ref 1.7–7.7)
Neutrophils Relative %: 87 %
Platelets: 187 10*3/uL (ref 150–400)
RBC: 4.93 MIL/uL (ref 4.22–5.81)
RDW: 12.3 % (ref 11.5–15.5)
WBC: 7.7 10*3/uL (ref 4.0–10.5)
nRBC: 0 % (ref 0.0–0.2)

## 2020-07-10 LAB — C-REACTIVE PROTEIN: CRP: 5.1 mg/dL — ABNORMAL HIGH (ref <1.0)

## 2020-07-10 LAB — COMPREHENSIVE METABOLIC PANEL
ALT: 30 U/L (ref 0–44)
AST: 59 U/L — ABNORMAL HIGH (ref 15–41)
Albumin: 3 g/dL — ABNORMAL LOW (ref 3.5–5.0)
Alkaline Phosphatase: 67 U/L (ref 38–126)
Anion gap: 11 (ref 5–15)
BUN: 24 mg/dL — ABNORMAL HIGH (ref 6–20)
CO2: 27 mmol/L (ref 22–32)
Calcium: 8.1 mg/dL — ABNORMAL LOW (ref 8.9–10.3)
Chloride: 102 mmol/L (ref 98–111)
Creatinine, Ser: 0.88 mg/dL (ref 0.61–1.24)
GFR calc Af Amer: >60 mL/min (ref >60)
GFR calc non Af Amer: >60 mL/min (ref >60)
Glucose, Bld: 225 mg/dL — ABNORMAL HIGH (ref 70–99)
Potassium: 4.2 mmol/L (ref 3.5–5.1)
Sodium: 140 mmol/L (ref 135–145)
Total Bilirubin: 0.8 mg/dL (ref 0.3–1.2)
Total Protein: 6 g/dL — ABNORMAL LOW (ref 6.5–8.1)

## 2020-07-10 LAB — LACTIC ACID, PLASMA: Lactic Acid, Venous: 1.9 mmol/L (ref 0.5–1.9)

## 2020-07-10 LAB — PROCALCITONIN: Procalcitonin: <0.1 ng/mL

## 2020-07-10 LAB — CBG MONITORING, ED: Glucose-Capillary: 146 mg/dL — ABNORMAL HIGH (ref 70–99)

## 2020-07-10 LAB — FIBRINOGEN: Fibrinogen: 522 mg/dL — ABNORMAL HIGH (ref 210–475)

## 2020-07-10 LAB — TRIGLYCERIDES: Triglycerides: 91 mg/dL (ref <150)

## 2020-07-10 LAB — LACTATE DEHYDROGENASE: LDH: 327 U/L — ABNORMAL HIGH (ref 98–192)

## 2020-07-10 LAB — FERRITIN: Ferritin: 663 ng/mL — ABNORMAL HIGH (ref 24–336)

## 2020-07-10 LAB — D-DIMER, QUANTITATIVE: D-Dimer, Quant: 0.55 ug/mL-FEU — ABNORMAL HIGH (ref 0.00–0.50)

## 2020-07-10 MED ORDER — INSULIN ASPART 100 UNIT/ML ~~LOC~~ SOLN
0.0000 [IU] | Freq: Every day | SUBCUTANEOUS | Status: DC
  Administered 2020-07-12: 2 [IU] via SUBCUTANEOUS
  Administered 2020-07-13 – 2020-07-14 (×2): 3 [IU] via SUBCUTANEOUS
  Filled 2020-07-10: qty 0.05

## 2020-07-10 MED ORDER — SODIUM CHLORIDE 0.9% FLUSH
3.0000 mL | Freq: Two times a day (BID) | INTRAVENOUS | Status: DC
  Administered 2020-07-10 – 2020-07-22 (×24): 3 mL via INTRAVENOUS

## 2020-07-10 MED ORDER — PREDNISONE 10 MG (21) PO TBPK: 10.0000 mg | ORAL_TABLET | Freq: Three times a day (TID) | ORAL | Status: DC

## 2020-07-10 MED ORDER — SODIUM CHLORIDE 0.9 % IV SOLN
100.0000 mg | Freq: Once | INTRAVENOUS | Status: AC
  Administered 2020-07-10: 100 mg via INTRAVENOUS
  Filled 2020-07-10 (×2): qty 20

## 2020-07-10 MED ORDER — INSULIN ASPART 100 UNIT/ML ~~LOC~~ SOLN
0.0000 [IU] | Freq: Three times a day (TID) | SUBCUTANEOUS | Status: DC
  Administered 2020-07-11: 2 [IU] via SUBCUTANEOUS
  Administered 2020-07-11: 5 [IU] via SUBCUTANEOUS
  Administered 2020-07-11: 3 [IU] via SUBCUTANEOUS
  Administered 2020-07-12: 2 [IU] via SUBCUTANEOUS
  Administered 2020-07-12 (×2): 3 [IU] via SUBCUTANEOUS
  Administered 2020-07-13: 11 [IU] via SUBCUTANEOUS
  Administered 2020-07-13: 8 [IU] via SUBCUTANEOUS
  Administered 2020-07-13: 12 [IU] via SUBCUTANEOUS
  Administered 2020-07-14: 8 [IU] via SUBCUTANEOUS
  Administered 2020-07-14 – 2020-07-15 (×3): 5 [IU] via SUBCUTANEOUS
  Administered 2020-07-15 (×2): 8 [IU] via SUBCUTANEOUS
  Filled 2020-07-10: qty 0.15

## 2020-07-10 MED ORDER — PREDNISONE 10 MG (21) PO TBPK
20.0000 mg | ORAL_TABLET | Freq: Every evening | ORAL | Status: AC
  Administered 2020-07-10: 20 mg via ORAL

## 2020-07-10 MED ORDER — ALBUTEROL SULFATE HFA 108 (90 BASE) MCG/ACT IN AERS
2.0000 | INHALATION_SPRAY | Freq: Once | RESPIRATORY_TRACT | Status: AC | PRN
  Administered 2020-07-10: 2 via RESPIRATORY_TRACT
  Filled 2020-07-10: qty 6.7

## 2020-07-10 MED ORDER — EMPAGLIFLOZIN 25 MG PO TABS
25.0000 mg | ORAL_TABLET | Freq: Every day | ORAL | Status: DC
  Administered 2020-07-11 – 2020-07-22 (×12): 25 mg via ORAL
  Filled 2020-07-10 (×12): qty 1

## 2020-07-10 MED ORDER — PREDNISONE 10 MG (21) PO TBPK: 10.0000 mg | ORAL_TABLET | ORAL | Status: DC

## 2020-07-10 MED ORDER — PREDNISONE 10 MG (21) PO TBPK
10.0000 mg | ORAL_TABLET | Freq: Three times a day (TID) | ORAL | Status: AC
  Administered 2020-07-11 (×3): 10 mg via ORAL

## 2020-07-10 MED ORDER — PREDNISONE 10 MG (21) PO TBPK
10.0000 mg | ORAL_TABLET | ORAL | Status: AC
  Administered 2020-07-10: 10 mg via ORAL

## 2020-07-10 MED ORDER — SILDENAFIL CITRATE 20 MG PO TABS: 20.0000 mg | ORAL_TABLET | Freq: Every day | ORAL | Status: DC | PRN

## 2020-07-10 MED ORDER — POLYETHYLENE GLYCOL 3350 17 G PO PACK
17.0000 g | PACK | Freq: Every day | ORAL | Status: DC | PRN
  Administered 2020-07-14 – 2020-07-21 (×2): 17 g via ORAL
  Filled 2020-07-10 (×2): qty 1

## 2020-07-10 MED ORDER — SODIUM CHLORIDE 0.9 % IV SOLN
100.0000 mg | Freq: Every day | INTRAVENOUS | Status: AC
  Administered 2020-07-11: 100 mg via INTRAVENOUS
  Filled 2020-07-10: qty 20

## 2020-07-10 MED ORDER — LISINOPRIL 20 MG PO TABS
40.0000 mg | ORAL_TABLET | Freq: Every day | ORAL | Status: DC
  Administered 2020-07-10 – 2020-07-12 (×3): 40 mg via ORAL
  Filled 2020-07-10 (×3): qty 2

## 2020-07-10 MED ORDER — INSULIN ASPART 100 UNIT/ML ~~LOC~~ SOLN
4.0000 [IU] | Freq: Three times a day (TID) | SUBCUTANEOUS | Status: DC
  Administered 2020-07-11 – 2020-07-15 (×15): 4 [IU] via SUBCUTANEOUS
  Filled 2020-07-10: qty 0.04

## 2020-07-10 MED ORDER — ASPIRIN EC 81 MG PO TBEC
81.0000 mg | DELAYED_RELEASE_TABLET | Freq: Every day | ORAL | Status: DC
  Administered 2020-07-11 – 2020-07-22 (×12): 81 mg via ORAL
  Filled 2020-07-10 (×12): qty 1

## 2020-07-10 MED ORDER — SODIUM CHLORIDE 0.9% FLUSH
3.0000 mL | INTRAVENOUS | Status: DC | PRN
  Administered 2020-07-11: 3 mL via INTRAVENOUS

## 2020-07-10 MED ORDER — SODIUM CHLORIDE 0.9 % IV SOLN: 250.0000 mL | INTRAVENOUS | Status: DC | PRN

## 2020-07-10 MED ORDER — PREDNISONE 10 MG (21) PO TBPK
10.0000 mg | ORAL_TABLET | Freq: Four times a day (QID) | ORAL | Status: DC
  Administered 2020-07-12: 10 mg via ORAL

## 2020-07-10 MED ORDER — FAMOTIDINE IN NACL 20-0.9 MG/50ML-% IV SOLN: 20.0000 mg | Freq: Once | INTRAVENOUS | Status: DC | PRN

## 2020-07-10 MED ORDER — INSULIN DETEMIR 100 UNIT/ML ~~LOC~~ SOLN
5.0000 [IU] | Freq: Every day | SUBCUTANEOUS | Status: DC
  Administered 2020-07-10 – 2020-07-13 (×4): 5 [IU] via SUBCUTANEOUS
  Filled 2020-07-10 (×5): qty 0.05

## 2020-07-10 MED ORDER — ATORVASTATIN CALCIUM 40 MG PO TABS
80.0000 mg | ORAL_TABLET | Freq: Every day | ORAL | Status: DC
  Administered 2020-07-11 – 2020-07-22 (×12): 80 mg via ORAL
  Filled 2020-07-10 (×13): qty 2

## 2020-07-10 MED ORDER — PREDNISONE 10 MG (21) PO TBPK: 10.0000 mg | ORAL_TABLET | Freq: Four times a day (QID) | ORAL | Status: DC

## 2020-07-10 MED ORDER — PREDNISONE 10 MG (21) PO TBPK
20.0000 mg | ORAL_TABLET | Freq: Every morning | ORAL | Status: AC
  Administered 2020-07-10: 20 mg via ORAL
  Filled 2020-07-10: qty 21

## 2020-07-10 MED ORDER — ENOXAPARIN SODIUM 40 MG/0.4ML ~~LOC~~ SOLN
40.0000 mg | Freq: Every day | SUBCUTANEOUS | Status: DC
  Administered 2020-07-11: 40 mg via SUBCUTANEOUS
  Filled 2020-07-10: qty 0.4

## 2020-07-10 MED ORDER — DIPHENHYDRAMINE HCL 50 MG/ML IJ SOLN: 50.0000 mg | Freq: Once | INTRAMUSCULAR | Status: DC | PRN

## 2020-07-10 MED ORDER — SODIUM CHLORIDE 0.9 % IV SOLN: INTRAVENOUS | Status: DC | PRN

## 2020-07-10 MED ORDER — METOPROLOL SUCCINATE ER 100 MG PO TB24
100.0000 mg | ORAL_TABLET | Freq: Every day | ORAL | Status: DC
  Administered 2020-07-10 – 2020-07-22 (×9): 100 mg via ORAL
  Filled 2020-07-10: qty 1
  Filled 2020-07-10: qty 2
  Filled 2020-07-10 (×3): qty 1
  Filled 2020-07-10: qty 2
  Filled 2020-07-10 (×2): qty 1
  Filled 2020-07-10: qty 2
  Filled 2020-07-10 (×3): qty 1

## 2020-07-10 MED ORDER — METHYLPREDNISOLONE SODIUM SUCC 125 MG IJ SOLR: 125.0000 mg | Freq: Once | INTRAMUSCULAR | Status: DC | PRN

## 2020-07-10 MED ORDER — PREDNISONE 10 MG (21) PO TBPK: 20.0000 mg | ORAL_TABLET | Freq: Every evening | ORAL | Status: DC

## 2020-07-10 MED ORDER — PREDNISONE 10 MG (21) PO TBPK
20.0000 mg | ORAL_TABLET | Freq: Every evening | ORAL | Status: AC
  Administered 2020-07-11: 20 mg via ORAL

## 2020-07-10 MED ORDER — PREDNISONE 10 MG (21) PO TBPK: 20.0000 mg | ORAL_TABLET | Freq: Every morning | ORAL | Status: DC

## 2020-07-10 MED ORDER — EPINEPHRINE 0.3 MG/0.3ML IJ SOAJ: 0.3000 mg | Freq: Once | INTRAMUSCULAR | Status: DC | PRN

## 2020-07-10 NOTE — ED Provider Notes (Signed)
[facility]-EMERGENCY DEPT Provider Note   CSN: [id number] Arrival date & time: 07/10/20  1329     History Chief Complaint  Patient presents with  . Shortness of Breath    Hypoxic, COVID +    Jake Mosley is a 59 y.o. male.  Jake Mosley is a 59 y.o. male with history of diabetes, hypertension, COVID-19 infection, who [facility] of hypoxia.  Patient was diagnosed with Covid on 8/11 and was admitted to the hospital for 1 day, but discharged with improving symptoms and set up for continued outpatient remdesivir infusions, he was getting his fourth dose of remdesivir today and was found to be hypoxic, they could not get oxygen saturations over 88%, patient requiring 5 L nasal cannula.  He states he is just been feeling more fatigued has not experienced a lot of shortness of breath and denies any chest pain.  He reports an occasional cough.  No fevers or chills.  Just reports feeling fatigued and generally weak with some myalgias.  No other aggravating or alleviating factors.        Past Medical History:  Diagnosis Date  . Diabetes mellitus without complication (HCC)   . Hypertension     Patient Active Problem List   Diagnosis Date Noted  . Acute respiratory failure with hypoxia (HCC) 07/10/2020  . Type 2 diabetes mellitus without complication (HCC) 07/10/2020  . Pneumonia due to COVID-19 virus 07/07/2020  . Syncope and collapse 07/07/2020  . Essential hypertension 07/07/2020  . Hyperlipidemia 07/07/2020    No past surgical history on file.     No family history on file.  Social History   Tobacco Use  . Smoking status: Never Smoker  . Smokeless tobacco: Never Used  Vaping Use  . Vaping Use: Never used  Substance Use Topics  . Alcohol use: Never  . Drug use: Never    Home Medications Prior to Admission medications   Medication Sig Start Date End Date Taking?  Authorizing Provider  predniSONE (DELTASONE) 10 MG tablet Take 4 tablets (40 mg total) by mouth daily for 4 days, THEN 3 tablets (30 mg total) daily for 4 days, THEN 2 tablets (20 mg total) daily for 4 days, THEN 1 tablet (10 mg total) daily for 4 days. 07/08/20 07/24/20 Yes Azucena FallenLancaster, William C, MD  aspirin 81 MG EC tablet Take 81 mg by mouth daily.     [provider]  atorvastatin (LIPITOR) 80 MG tablet Take 80 mg by mouth daily. 06/16/20   [provider]  JARDIANCE 25 MG TABS tablet Take 25 mg by mouth daily. 06/16/20   [provider]  lisinopril (ZESTRIL) 40 MG tablet Take 40 mg by mouth daily. 05/16/20   [provider]  metFORMIN (GLUCOPHAGE-XR) 500 MG 24 hr tablet Take 1,000 mg by mouth 2 (two) times daily. 06/16/20   [provider]  metoprolol succinate (TOPROL-XL) 100 MG 24 hr tablet Take 100 mg by mouth daily. 06/16/20   [provider]  sildenafil (REVATIO) 20 MG tablet Take 20-60 mg by mouth daily as needed (erectile dysfunction).  03/23/20   [provider]    Allergies    Patient has no known allergies.  Review of Systems   Review of Systems  Constitutional: Positive for fatigue. Negative for chills and fever.  Respiratory: Positive for cough. Negative for chest tightness and shortness of breath.   Cardiovascular: Negative for chest pain.  Gastrointestinal:  Negative for abdominal pain, nausea and vomiting.  Musculoskeletal: Negative for arthralgias and myalgias.  Skin: Negative for color change and rash.  Neurological: Positive for weakness (Generalized). Negative for dizziness, syncope and light-headedness.    Physical Exam Updated Vital Signs BP 126/71 (BP Location: Right Arm)   Pulse 66   Temp 98.4 F (36.9 C)   Resp 20   SpO2 90%   Physical Exam Vitals and nursing note reviewed.  Constitutional:      General: He is not in acute distress.    Appearance: He is well-developed. He is obese. He is  ill-appearing. He is not diaphoretic.     Comments: Obese male somewhat ill-appearing but in no acute distress  HENT:     Head: Normocephalic and atraumatic.     Mouth/Throat:     Mouth: Mucous membranes are moist.     Pharynx: Oropharynx is clear.  Eyes:     General:        Right eye: No discharge.        Left eye: No discharge.     Pupils: Pupils are equal, round, and reactive to light.  Cardiovascular:     Rate and Rhythm: Normal rate and regular rhythm.     Heart sounds: Normal heart sounds.  Pulmonary:     Effort: Pulmonary effort is normal. Tachypnea present. No respiratory distress.     Breath sounds: Decreased breath sounds present. No wheezing or rales.     Comments: Patient mildly tachypneic but with normal respiratory effort, satting at 92% on 5 L nasal cannula, on exam patient has some decreased breath sounds bilaterally but without wheezes, rales or rhonchi Chest:     Chest wall: No tenderness.  Abdominal:     General: Bowel sounds are normal. There is no distension.     Palpations: Abdomen is soft. There is no mass.     Tenderness: There is no abdominal tenderness. There is no guarding.     Comments: Abdomen soft, nondistended, nontender to palpation in all quadrants without guarding or peritoneal signs  Musculoskeletal:        General: No deformity.     Cervical back: Neck supple.     Right lower leg: No tenderness. No edema.     Left lower leg: No tenderness. No edema.  Skin:    General: Skin is warm and dry.     Capillary Refill: Capillary refill takes less than 2 seconds.  Neurological:     Mental Status: He is alert.     Coordination: Coordination normal.     Comments: Speech is clear, able to follow commands Moves extremities without ataxia, coordination intact  Psychiatric:        Mood and Affect: Mood normal.        Behavior: Behavior normal.     ED Results / Procedures / Treatments   Labs (all labs ordered are listed, but only abnormal results are  displayed) Labs Reviewed  CBC WITH DIFFERENTIAL/PLATELET - Abnormal; Notable for the following components:      Result Value   Lymphs Abs 0.5 (*)    Abs Immature Granulocytes 0.08 (*)    All other components within normal limits  COMPREHENSIVE METABOLIC PANEL - Abnormal; Notable for the following components:   Glucose, Bld 225 (*)    BUN 24 (*)    Calcium 8.1 (*)    Total Protein 6.0 (*)    Albumin 3.0 (*)    AST 59 (*)    All  other components within normal limits  D-DIMER, QUANTITATIVE (NOT AT [facility]) - Abnormal; Notable for the following components:   D-Dimer, Quant 0.55 (*)    All other components within normal limits  LACTATE DEHYDROGENASE - Abnormal; Notable for the following components:   LDH 327 (*)    All other components within normal limits  FERRITIN - Abnormal; Notable for the following components:   Ferritin 663 (*)    All other components within normal limits  FIBRINOGEN - Abnormal; Notable for the following components:   Fibrinogen 522 (*)    All other components within normal limits  C-REACTIVE PROTEIN - Abnormal; Notable for the following components:   CRP 5.1 (*)    All other components within normal limits  CULTURE, BLOOD (ROUTINE X 2)  CULTURE, BLOOD (ROUTINE X 2)  LACTIC ACID, PLASMA  PROCALCITONIN  TRIGLYCERIDES  LACTIC ACID, PLASMA    EKG EKG Interpretation  Date/Time:  Saturday July 10 2020 14:15:50 EDT Ventricular Rate:  58 PR Interval:    QRS Duration: 97 QT Interval:  411 QTC Calculation: 404 R Axis:   -47 Text Interpretation: Sinus rhythm Inferior infarct, old Consider anterior infarct Confirmed by Lorre Nick ([id number]) on 07/10/2020 4:24:56 PM   Radiology DG Chest Port 1 View  Result Date: 07/10/2020 CLINICAL DATA:  COVID positive EXAM: PORTABLE CHEST 1 VIEW COMPARISON:  Chest radiograph dated 07/07/2020. FINDINGS: The heart size and mediastinal contours are within normal limits. Moderate patchy bilateral airspace opacities have  increased since 07/07/2020. No pleural effusion or pneumothorax. The visualized skeletal structures are unremarkable. IMPRESSION: Increased moderate patchy bilateral airspace opacities consistent with COVID-19 pneumonia. Electronically Signed   By: Romona Curls M.D.   On: 07/10/2020 15:06    Procedures Procedures (including critical care time)  Medications Ordered in ED Medications  remdesivir 100 mg in sodium chloride 0.9 % 100 mL IVPB (has no administration in time range)    ED Course  I have reviewed the triage vital signs and the nursing notes.  Pertinent labs & imaging results that were available during my care of the patient were reviewed by me and considered in my medical decision making (see chart for details).    MDM Rules/Calculators/A&P                         Patient with known COVID-19 infection, was initially diagnosed on 8/11, and was admitted to the hospital for 1 day but discharged and had been receiving outpatient remdesivir infusions.  Today he presented to the infusion center for treatment, but they could not get his O2 sats over 88% on room air, they reports that ranging 81-88%.  He was sent from infusion center for further evaluation and admission.  On arrival he is requiring 5 L nasal cannula, he does not feel significantly short of breath just worsening fatigue.  No associated chest pain, he is afebrile.  Lungs are clear without significantly increased work of breathing and patient remained stable on 5 L nasal cannula.  Patient has high potential to clinically worsen and will require admission  Will get Covid inflammatory markers and chest x-ray as well as basic labs and call for admission.  Labs without leukocytosis, glucose of 225, no other significant electrolyte derangements, elevated inflammatory markers.  Chest x-ray with worsening patchy bilateral airspace disease consistent with Covid pneumonia.   Jake Mosley was evaluated in Emergency Department on  07/10/2020 for the symptoms described in the history of present illness. He  was evaluated in the context of the global COVID-19 pandemic, which necessitated consideration that the patient might be at risk for infection with the SARS-CoV-2 virus that causes COVID-19. Institutional protocols and algorithms that pertain to the evaluation of patients at risk for COVID-19 are in a state of rapid change based on information released by regulatory bodies including the CDC and federal and state organizations. These policies and algorithms were followed during the patient's care in the ED.  Final Clinical Impression(s) / ED Diagnoses Final diagnoses:  Acute hypoxemic respiratory failure due to COVID-19 [facility])  Pneumonia due to COVID-19 virus    Rx / DC Orders ED Discharge Orders    None       Legrand Rams 07/10/20 1930    Lorre Nick, MD 07/11/20 1415

## 2020-07-10 NOTE — Progress Notes (Addendum)
Pt arrived to outpatient covid infusion clinic for dose number 4 of Remdesivir. Pt assisted from wheelchair to recliner. Vital signs checked and O2 in low 80s. Pt rested for , highest O2 was 88%, but median range around 83%. Albuterol inhaler given, no rise in oxygen level. Oxygen applied at 5L [place], O2 sats around 90%. Pt denies SOB, not having distress, resting comfortably in chair. IV started and remdesivir started. RN called Ephriam Knuckles, RN rapid response nurse and he notified [facility], Trey Paula. Awaiting either bed in ED or patient will be taken to triage. Pt's wife Roanna Raider called at [phone] and informed her of her husband's POC.   1230 Pt resting comfortably on 5LNC, O2 sats 90-92%. WCTM.   1310 RN called ED and room 16 ready for pt. Pt transferred via wheelchair on oxygen.    Diagnosis: COVID-19  Physician: Dr. Shan Levans  Procedure: Covid Infusion Clinic Med: remdesivir infusion - Provided patient with remdesivir fact sheet for patients, parents and caregivers prior to infusion.  Complications: Low O2 sats on admission, pt taken to [facility]

## 2020-07-10 NOTE — H&P (Signed)
History and Physical  Jake Mosley ZOX:[id number] DOB: Aug 29, 1961 DOA: 07/10/2020  PCP: Jake Mosley., MD Patient coming from: Home  I have personally briefly reviewed patient's old medical records in [facility]   Chief Complaint: Sent from the infusion clinic due to hypoxia  HPI: Jake Mosley is a 59 y.o. male past medical history of hypertension diabetes mellitus and hyperlipidemia who was recently discharged from the hospital on 07/08/2020 for COVID-19 he was discharged home and was set to finish his remdesivir treatment as an outpatient and he relates he is began getting more and more weak, at the infusion center  on 07/10/2020 he was found to be hypoxic satting 86 was placed on 2 L of oxygen and his saturations keep being 86 to he was referred to the ED.  In the ED: It is watching to get his saturations at 92% other vital signs were stable, was low yield blood glucose was 220 he had no leukocytosis, chest x-ray when unchanged from previous.   Review of Systems: All systems reviewed and apart from history of presenting illness, are negative.  Past Medical History:  Diagnosis Date  . Diabetes mellitus without complication (HCC)   . Hypertension    No past surgical history on file. Social History:  reports that he has never smoked. He has never used smokeless tobacco. He reports that he does not drink alcohol and does not use drugs.   No Known Allergies  He relates mother and father had diabetes and hypertension  Prior to Admission medications   Medication Sig Start Date End Date Taking? Authorizing Provider  predniSONE (DELTASONE) 10 MG tablet Take 4 tablets (40 mg total) by mouth daily for 4 days, THEN 3 tablets (30 mg total) daily for 4 days, THEN 2 tablets (20 mg total) daily for 4 days, THEN 1 tablet (10 mg total) daily for 4 days. 07/08/20 07/24/20 Yes Azucena Fallen, MD  aspirin 81 MG EC tablet Take 81 mg by mouth daily.     [provider]    atorvastatin (LIPITOR) 80 MG tablet Take 80 mg by mouth daily. 06/16/20   [provider]  JARDIANCE 25 MG TABS tablet Take 25 mg by mouth daily. 06/16/20   [provider]  lisinopril (ZESTRIL) 40 MG tablet Take 40 mg by mouth daily. 05/16/20   [provider]  metFORMIN (GLUCOPHAGE-XR) 500 MG 24 hr tablet Take 1,000 mg by mouth 2 (two) times daily. 06/16/20   [provider]  metoprolol succinate (TOPROL-XL) 100 MG 24 hr tablet Take 100 mg by mouth daily. 06/16/20   [provider]  sildenafil (REVATIO) 20 MG tablet Take 20-60 mg by mouth daily as needed (erectile dysfunction).  03/23/20   [provider]   Physical Exam: Vitals:   07/10/20 1358 07/10/20 1439 07/10/20 1605  BP: 126/71 103/64 121/75  Pulse: 66 (!) 51 (!) 58  Resp: 20 (!) 26 (!) 25  Temp: 98.4 F (36.9 C)  98.4 F (36.9 C)  TempSrc:   Oral  SpO2: 90% 92% 91%     General exam: Moderately built and nourished patient, lying comfortably supine on the gurney in no obvious distress.  Head, eyes and ENT: Nontraumatic and normocephalic. Pupils equally reacting to light and accommodation. Oral mucosa moist.  Neck: Supple. No JVD, carotid bruit or thyromegaly.  Lymphatics: No lymphadenopathy.  Respiratory system: Good air movement with crackles bilaterally.  Cardiovascular system: S1 and S2 heard, RRR. No JVD, murmurs, gallops,  clicks or pedal edema.  Gastrointestinal system: Abdomen is nondistended, soft and nontender. Normal bowel sounds heard. No organomegaly or masses appreciated.  Central nervous system: Alert and oriented. No focal neurological deficits.  Extremities: Symmetric 5 x 5 power. Peripheral pulses symmetrically felt.   Skin: No rashes or acute findings.  Musculoskeletal system: Negative exam.  Psychiatry: Pleasant and cooperative.   Labs on Admission:  Basic Metabolic Panel: Recent Labs  Lab 07/07/20 1638 07/07/20 2313 07/08/20 0243  07/10/20 1505  NA 135  --  136 140  K 4.0  --  4.5 4.2  CL 97*  --  102 102  CO2 26  --  26 27  GLUCOSE 188*  --  183* 225*  BUN 16  --  19 24*  CREATININE 1.21 1.17 1.11 0.88  CALCIUM 7.9*  --  7.4* 8.1*  MG  --   --  2.0  --    Liver Function Tests: Recent Labs  Lab 07/07/20 1638 07/08/20 0243 07/10/20 1505  AST 40 32 59*  ALT 22 22 30   ALKPHOS 72 64 67  BILITOT 0.4 0.5 0.8  PROT 6.7 6.1* 6.0*  ALBUMIN 3.4* 3.0* 3.0*   No results for input(s): LIPASE, AMYLASE in the last 168 hours. No results for input(s): AMMONIA in the last 168 hours. CBC: Recent Labs  Lab 07/07/20 1638 07/07/20 2313 07/08/20 0243 07/10/20 1505  WBC 4.2 3.3* 3.2* 7.7  NEUTROABS 3.4  --  2.2 6.7  HGB 15.1 13.9 14.1 14.9  HCT 46.5 42.7 43.7 44.9  MCV 91.9 93.0 94.8 91.1  PLT 136* 124* 124* 187   Cardiac Enzymes: No results for input(s): CKTOTAL, CKMB, CKMBINDEX, TROPONINI in the last 168 hours.  BNP (last 3 results) No results for input(s): PROBNP in the last 8760 hours. CBG: Recent Labs  Lab 07/07/20 2216 07/08/20 0820 07/08/20 1152  GLUCAP 196* 119* 210*    Radiological Exams on Admission: DG Chest Port 1 View  Result Date: 07/10/2020 CLINICAL DATA:  COVID positive EXAM: PORTABLE CHEST 1 VIEW COMPARISON:  Chest radiograph dated 07/07/2020. FINDINGS: The heart size and mediastinal contours are within normal limits. Moderate patchy bilateral airspace opacities have increased since 07/07/2020. No pleural effusion or pneumothorax. The visualized skeletal structures are unremarkable. IMPRESSION: Increased moderate patchy bilateral airspace opacities consistent with COVID-19 pneumonia. Electronically Signed   By: 09/06/2020 M.D.   On: 07/10/2020 15:06    EKG: Independently reviewed.   Assessment/Plan Acute respiratory failure with hypoxia secondary to pneumonia due to COVID-19 virus: Admitted to MedSurg continue him on oxygen try to keep saturations greater than 94%, will start him on  IV  Remdesivir and steroids, he was not taking steroids at home. Will follow inflammatory markers, try to keep the patient preferably 16 hours a day if not prone out of bed to chair continue incentive spirometry and flutter valve. Procalcitonin less than 0.1 we will not start empiric antibiotics at this point.  Essential hypertension: Continue current home regimen.  Type 2 diabetes mellitus without complication (HCC): Hypoglycemic agent except for Metformin, will start on long-acting insulin plus sliding scale.     DVT Prophylaxis: lovenox Code Status: full  Family Communication: none  Disposition Plan: inpatient   It is my clinical opinion that admission to INPATIENT is reasonable and necessary in this 59 y.o. male recently discharged with Covid pneumonia was found to be hypoxic effusion clinical admitted back to the hospital as inpatient on IV Remdesivir and steroids.  Given the aforementioned,  the predictability of an adverse outcome is felt to be significant. I expect that the patient will require at least 2 midnights in the hospital to treat this condition.  Marinda Elk MD [facility]   07/10/2020, 4:49 PM

## 2020-07-10 NOTE — Discharge Instructions (Signed)
10 Things You Can Do to Manage Your COVID-19 Symptoms at Home If you have possible or confirmed COVID-19: 1. Stay home from work and school. And stay away from other public places. If you must go out, avoid using any kind of public transportation, ridesharing, or taxis. 2. Monitor your symptoms carefully. If your symptoms get worse, call your healthcare provider immediately. 3. Get rest and stay hydrated. 4. If you have a medical appointment, call the healthcare provider ahead of time and tell them that you have or may have COVID-19. 5. For medical emergencies, call 911 and notify the dispatch personnel that you have or may have COVID-19. 6. Cover your cough and sneezes with a tissue or use the inside of your elbow. 7. Wash your hands often with soap and water for at least 20 seconds or clean your hands with an alcohol-based hand sanitizer that contains at least 60% alcohol. 8. As much as possible, stay in a specific room and away from other people in your home. Also, you should use a separate bathroom, if available. If you need to be around other people in or outside of the home, wear a mask. 9. Avoid sharing personal items with other people in your household, like dishes, towels, and bedding. 10. Clean all surfaces that are touched often, like counters, tabletops, and doorknobs. Use household cleaning sprays or wipes according to the label instructions. cdc.gov/coronavirus 05/28/2019 This information is not intended to replace advice given to you by your health care provider. Make sure you discuss any questions you have with your health care provider. Document Revised: 10/30/2019 Document Reviewed: 10/30/2019 Elsevier Patient Education  2020 Elsevier Inc.  

## 2020-07-10 NOTE — ED Triage Notes (Signed)
Patient coming room infusion center with complain of decreased in oxygen sats. 81-88 % on room air. Patient test positive for covid19 last wed. Patient denies sob at this time.

## 2020-07-11 ENCOUNTER — Ambulatory Visit (HOSPITAL_COMMUNITY): Payer: 59

## 2020-07-11 LAB — COMPREHENSIVE METABOLIC PANEL
ALT: 27 U/L (ref 0–44)
AST: 45 U/L — ABNORMAL HIGH (ref 15–41)
Albumin: 3.1 g/dL — ABNORMAL LOW (ref 3.5–5.0)
Alkaline Phosphatase: 64 U/L (ref 38–126)
Anion gap: 12 (ref 5–15)
BUN: 22 mg/dL — ABNORMAL HIGH (ref 6–20)
CO2: 27 mmol/L (ref 22–32)
Calcium: 8.1 mg/dL — ABNORMAL LOW (ref 8.9–10.3)
Chloride: 101 mmol/L (ref 98–111)
Creatinine, Ser: 0.95 mg/dL (ref 0.61–1.24)
GFR calc Af Amer: >60 mL/min (ref >60)
GFR calc non Af Amer: >60 mL/min (ref >60)
Glucose, Bld: 220 mg/dL — ABNORMAL HIGH (ref 70–99)
Potassium: 4 mmol/L (ref 3.5–5.1)
Sodium: 140 mmol/L (ref 135–145)
Total Bilirubin: 1 mg/dL (ref 0.3–1.2)
Total Protein: 6 g/dL — ABNORMAL LOW (ref 6.5–8.1)

## 2020-07-11 LAB — CBC WITH DIFFERENTIAL/PLATELET
Abs Immature Granulocytes: 0.08 10*3/uL — ABNORMAL HIGH (ref 0.00–0.07)
Basophils Absolute: 0 10*3/uL (ref 0.0–0.1)
Basophils Relative: 0 %
Eosinophils Absolute: 0 10*3/uL (ref 0.0–0.5)
Eosinophils Relative: 0 %
HCT: 43.9 % (ref 39.0–52.0)
Hemoglobin: 14.4 g/dL (ref 13.0–17.0)
Immature Granulocytes: 1 %
Lymphocytes Relative: 10 %
Lymphs Abs: 0.6 10*3/uL — ABNORMAL LOW (ref 0.7–4.0)
MCH: 30 pg (ref 26.0–34.0)
MCHC: 32.8 g/dL (ref 30.0–36.0)
MCV: 91.5 fL (ref 80.0–100.0)
Monocytes Absolute: 0.3 10*3/uL (ref 0.1–1.0)
Monocytes Relative: 5 %
Neutro Abs: 5.2 10*3/uL (ref 1.7–7.7)
Neutrophils Relative %: 84 %
Platelets: 198 10*3/uL (ref 150–400)
RBC: 4.8 MIL/uL (ref 4.22–5.81)
RDW: 12.4 % (ref 11.5–15.5)
WBC: 6.3 10*3/uL (ref 4.0–10.5)
nRBC: 0 % (ref 0.0–0.2)

## 2020-07-11 LAB — D-DIMER, QUANTITATIVE: D-Dimer, Quant: 0.6 ug/mL-FEU — ABNORMAL HIGH (ref 0.00–0.50)

## 2020-07-11 LAB — C-REACTIVE PROTEIN: CRP: 7.7 mg/dL — ABNORMAL HIGH (ref <1.0)

## 2020-07-11 MED ORDER — ENOXAPARIN SODIUM 60 MG/0.6ML ~~LOC~~ SOLN
60.0000 mg | Freq: Every day | SUBCUTANEOUS | Status: DC
  Administered 2020-07-11 – 2020-07-21 (×11): 60 mg via SUBCUTANEOUS
  Filled 2020-07-11 (×11): qty 0.6

## 2020-07-11 NOTE — Progress Notes (Signed)
Called to room for patient who has saturations of 87% on NRB mask.  Pt. Is without distress, no complaints offered of dyspnea.  Patient does state that the "alarms keep going off".  Placed patient on salter [place] at 6 lpm with saturations of 92% at this time.  Informed RN that saturations of 85% are acceptable with this disease process unless signs of distress are noted.  Coached patient on proper breathing techniques. Will be available for changes if needed.

## 2020-07-11 NOTE — ED Notes (Signed)
CBG does not transfer. Blood sugar of 184 was taken at 11:30 am

## 2020-07-11 NOTE — ED Notes (Signed)
Upon entering pt's room pt's O2 sat's are 75%. Pt was on 10L non-humidified [place] when entering room laying on L side. Pt repositioned and sat up in bed, O2 increased to 78%. Pt transitioned to 15L NRB mask and improved to 83-85%. RT called to assess pt.

## 2020-07-11 NOTE — Progress Notes (Signed)
PROGRESS NOTE    Jake Mosley  BSJ:[id number] DOB: [birth year] DOA: 07/10/2020 PCP: Elijio Miles., MD   Brief Narrative:  Jake Mosley is a 59 y.o. male past medical history of hypertension diabetes mellitus and hyperlipidemia who was recently discharged from the hospital on 07/08/2020 for COVID-19 he was discharged home and was set to finish his remdesivir treatment as an outpatient and he relates he is began getting more and more weak, at the infusion center  on 07/10/2020 he was found to be hypoxic satting 86 was placed on 2 L of oxygen and his saturations keep being 86 to he was referred to the ED. In the ED other vital signs were stable, was low yield blood glucose was 220 he had no leukocytosis, chest x-ray when unchanged from previous.   Assessment & Plan:   Active Problems:   Pneumonia due to COVID-19 virus   Essential hypertension   Acute respiratory failure with hypoxia (HCC)   Type 2 diabetes mellitus without complication (HCC)  Acute respiratory failure with hypoxia secondary to pneumonia due to COVID-19 virus: Admitted to MedSurg continue him on oxygen try to keep saturations greater than 94%, will start him on IV  Remdesivir and steroids, he was not taking steroids at home despite prescription and discussion at discharge -likely the cause of his worsening status and readmission. Will follow inflammatory markers, try to keep the patient preferably 16 hours a day if not prone out of bed to chair continue incentive spirometry and flutter valve. Procalcitonin less than 0.1 we will not start empiric antibiotics at this point. SpO2: 96 % O2 Flow Rate (L/min): 5 L/min  Recent Labs    07/10/20 1505 07/11/20 0500  DDIMER 0.55* 0.60*  FERRITIN 663*  --   LDH 327*  --   CRP 5.1* 7.7*    Lab Results  Component Value Date   SARSCOV2NAA POSITIVE (A) 07/07/2020    Essential hypertension: Continue current home regimen.  Type 2 diabetes mellitus without complication  (HCC): Hypoglycemic agent except for Metformin, will start on long-acting insulin plus sliding scale.   DVT Prophylaxis: lovenox Code Status: full  Family Communication: Updated over the phone  Status is: Inpatient  Dispo: The patient is from: Home              Anticipated d/c is to: Home              Anticipated d/c date is: 24 to 48 hours              Patient currently not medically stable for discharge given ongoing acute hypoxic respiratory failure with symptomatic dyspnea in the setting of COVID-19 pneumonia  Consultants:   None  Procedures:   None  Antimicrobials:  Remdesivir  Subjective: No acute issues or events overnight, respiratory status continues to improve albeit not back to baseline per patient, otherwise denies nausea, vomiting, diarrhea, constipation, headache, fevers, chills.  Objective: Vitals:   07/11/20 0300 07/11/20 0330 07/11/20 0550 07/11/20 0630  BP: (!) 100/51 125/82 (!) 103/55 119/78  Pulse: (!) 51 (!) 51 61 (!) 46  Resp: (!) 26 (!) 24 (!) 29 (!) 23  Temp:      TempSrc:      SpO2: 90% 90% 90% 96%   No intake or output data in the 24 hours ending 07/11/20 0725 There were no vitals filed for this visit.  Examination:  General exam: Appears calm and comfortable  Respiratory system: Clear to auscultation. Respiratory effort normal. Cardiovascular system:  S1 & S2 heard, RRR. No JVD, murmurs, rubs, gallops or clicks. No pedal edema. Gastrointestinal system: Abdomen is nondistended, soft and nontender. No organomegaly or masses felt. Normal bowel sounds heard. Central nervous system: Alert and oriented. No focal neurological deficits. Extremities: Symmetric 5 x 5 power. Skin: No rashes, lesions or ulcers Psychiatry: Judgement and insight appear normal. Mood & affect appropriate.     Data Reviewed: I have personally reviewed following labs and imaging studies  CBC: Recent Labs  Lab 07/07/20 1638 07/07/20 2313 07/08/20 0243  07/10/20 1505 07/11/20 0500  WBC 4.2 3.3* 3.2* 7.7 6.3  NEUTROABS 3.4  --  2.2 6.7 5.2  HGB 15.1 13.9 14.1 14.9 14.4  HCT 46.5 42.7 43.7 44.9 43.9  MCV 91.9 93.0 94.8 91.1 91.5  PLT 136* 124* 124* 187 198   Basic Metabolic Panel: Recent Labs  Lab 07/07/20 1638 07/07/20 2313 07/08/20 0243 07/10/20 1505 07/11/20 0500  NA 135  --  136 140 140  K 4.0  --  4.5 4.2 4.0  CL 97*  --  102 102 101  CO2 26  --  26 27 27   GLUCOSE 188*  --  183* 225* 220*  BUN 16  --  19 24* 22*  CREATININE 1.21 1.17 1.11 0.88 0.95  CALCIUM 7.9*  --  7.4* 8.1* 8.1*  MG  --   --  2.0  --   --    GFR: Estimated Creatinine Clearance: 103.9 mL/min (by C-G formula based on SCr of 0.95 mg/dL). Liver Function Tests: Recent Labs  Lab 07/07/20 1638 07/08/20 0243 07/10/20 1505 07/11/20 0500  AST 40 32 59* 45*  ALT 22 22 30 27   ALKPHOS 72 64 67 64  BILITOT 0.4 0.5 0.8 1.0  PROT 6.7 6.1* 6.0* 6.0*  ALBUMIN 3.4* 3.0* 3.0* 3.1*   No results for input(s): LIPASE, AMYLASE in the last 168 hours. No results for input(s): AMMONIA in the last 168 hours. Coagulation Profile: Recent Labs  Lab 07/07/20 1638  INR 1.0   Cardiac Enzymes: No results for input(s): CKTOTAL, CKMB, CKMBINDEX, TROPONINI in the last 168 hours. BNP (last 3 results) No results for input(s): PROBNP in the last 8760 hours. HbA1C: No results for input(s): HGBA1C in the last 72 hours. CBG: Recent Labs  Lab 07/07/20 2216 07/08/20 0820 07/08/20 1152 07/10/20 2238  GLUCAP 196* 119* 210* 146*   Lipid Profile: Recent Labs    07/10/20 1505  TRIG 91   Thyroid Function Tests: No results for input(s): TSH, T4TOTAL, FREET4, T3FREE, THYROIDAB in the last 72 hours. Anemia Panel: Recent Labs    07/10/20 1505  FERRITIN 663*   Sepsis Labs: Recent Labs  Lab 07/07/20 1638 07/07/20 1848 07/10/20 1505  PROCALCITON <0.10  --  <0.10  LATICACIDVEN 2.1* 2.0* 1.9    Recent Results (from the past 240 hour(s))  Blood Culture (routine x  2)     Status: None (Preliminary result)   Collection Time: 07/07/20  4:38 PM   Specimen: BLOOD  Result Value Ref Range Status   Specimen Description   Final    BLOOD RIGHT ANTECUBITAL Performed at [facility] [place], [address] [place], [place] [address]    Special Requests   Final    BOTTLES DRAWN AEROBIC AND ANAEROBIC Blood Culture adequate volume Performed at [facility] [place], [address] [place], [place] [address]    Culture   Final    NO GROWTH 3 DAYS Performed at [facility], [address], [place], [place] [address]    Report Status PENDING  Incomplete  SARS Coronavirus 2 by RT PCR (hospital order, performed in [facility] hospital lab) Nasopharyngeal Nasopharyngeal Swab     Status: Abnormal   Collection Time: 07/07/20  4:38 PM   Specimen: Nasopharyngeal Swab  Result Value Ref Range Status   SARS Coronavirus 2 POSITIVE (A) NEGATIVE Final    Comment: RESULT CALLED TO, READ BACK BY AND VERIFIED WITH: MARCUM RUTH RN AT 1740 ON 07/07/20 BY [email or web address] (NOTE) SARS-CoV-2 target nucleic acids are DETECTED  SARS-CoV-2 RNA is generally detectable in upper respiratory specimens  during the acute phase of infection.  Positive results are indicative  of the presence of the identified virus, but do not rule out bacterial infection or co-infection with other pathogens not detected by the test.  Clinical correlation with patient history and  other diagnostic information is necessary to determine patient infection status.  The expected result is negative.  Fact Sheet for Patients:   BoilerBrush.com.cy   Fact Sheet for Healthcare Providers:   https://pope.com/    This test is not yet approved or cleared by the [place] FDA and  has been authorized for detection and/or diagnosis of SARS-CoV-2 by FDA under an Emergency Use Authorization (EUA).  This EUA will remain in effect (meani ng this test can be used)  for the duration of  the COVID-19 declaration under Section 564(b)(1) of the Act, 21 U.S.C. section 360-bbb-3(b)(1), unless the authorization is terminated or revoked sooner.  Performed at [facility], [address] [place], [place] [address]   Blood Culture (routine x 2)     Status: None (Preliminary result)   Collection Time: 07/07/20  4:58 PM   Specimen: BLOOD  Result Value Ref Range Status   Specimen Description   Final    BLOOD BLOOD RIGHT HAND Performed at [facility], [address] [place], [place] [address]    Special Requests   Final    BOTTLES DRAWN AEROBIC AND ANAEROBIC Blood Culture adequate volume Performed at [facility], [address] [place], [place] [address]    Culture   Final    NO GROWTH 3 DAYS Performed at [facility], [address], [place], [place] [address]    Report Status PENDING  Incomplete  Respiratory Panel by PCR     Status: None   Collection Time: 07/07/20 10:32 PM   Specimen: Nasopharyngeal Swab; Respiratory  Result Value Ref Range Status   Adenovirus NOT DETECTED NOT DETECTED Final   Coronavirus 229E NOT DETECTED NOT DETECTED Final    Comment: (NOTE) The Coronavirus on the Respiratory Panel, DOES NOT test for the novel  Coronavirus (2019 nCoV)    Coronavirus HKU1 NOT DETECTED NOT DETECTED Final   Coronavirus NL63 NOT DETECTED NOT DETECTED Final   Coronavirus OC43 NOT DETECTED NOT DETECTED Final   Metapneumovirus NOT DETECTED NOT DETECTED Final   Rhinovirus / Enterovirus NOT DETECTED NOT DETECTED Final   Influenza A NOT DETECTED NOT DETECTED Final   Influenza B NOT DETECTED NOT DETECTED Final   Parainfluenza Virus 1 NOT DETECTED NOT DETECTED Final   Parainfluenza Virus 2 NOT DETECTED NOT DETECTED Final   Parainfluenza Virus 3 NOT DETECTED NOT DETECTED Final   Parainfluenza Virus 4 NOT DETECTED NOT DETECTED Final   Respiratory Syncytial Virus NOT DETECTED NOT DETECTED Final   Bordetella  pertussis NOT DETECTED NOT DETECTED Final  Chlamydophila pneumoniae NOT DETECTED NOT DETECTED Final   Mycoplasma pneumoniae NOT DETECTED NOT DETECTED Final    Comment: Performed at [facility], [address] [place], [place] [address]  Culture, blood (Routine X 2) w Reflex to ID Panel     Status: None (Preliminary result)   Collection Time: 07/07/20 11:13 PM   Specimen: BLOOD  Result Value Ref Range Status   Specimen Description   Final    BLOOD BLOOD LEFT HAND Performed at [facility], [address] [place], [place] [address]    Special Requests   Final    BOTTLES DRAWN AEROBIC ONLY Blood Culture adequate volume Performed at [facility], [address] [place], [place] [address]    Culture   Final    NO GROWTH 2 DAYS Performed at [facility], [address], [place], [place] [address]    Report Status PENDING  Incomplete  Culture, blood (Routine X 2) w Reflex to ID Panel     Status: None (Preliminary result)   Collection Time: 07/07/20 11:13 PM   Specimen: BLOOD  Result Value Ref Range Status   Specimen Description   Final    BLOOD RIGHT ANTECUBITAL Performed at [facility], [address] [place], [place] [address]    Special Requests   Final    BOTTLES DRAWN AEROBIC AND ANAEROBIC Blood Culture adequate volume Performed at [facility], [address] [place], [place] [address]    Culture   Final    NO GROWTH 2 DAYS Performed at [facility], [address] [place], [place] [address]    Report Status PENDING  Incomplete  Urine culture     Status: None   Collection Time: 07/08/20  2:17 AM   Specimen: In/Out Cath Urine  Result Value Ref Range Status   Specimen Description   Final    IN/OUT CATH URINE Performed at [facility], [address] [place], [place] [address]    Special Requests   Final    NONE Performed at [facility], [address] [place], [place] [address]    Culture   Final    NO GROWTH Performed at [facility], [address] [place], [place] [address]    Report Status 07/08/2020 FINAL  Final         Radiology Studies: DG Chest Port 1 View  Result Date: 07/10/2020 CLINICAL DATA:  COVID positive EXAM: PORTABLE CHEST 1 VIEW COMPARISON:  Chest radiograph dated 07/07/2020. FINDINGS: The heart size and mediastinal contours are within normal limits. Moderate patchy bilateral airspace opacities have increased since 07/07/2020. No pleural effusion or pneumothorax. The visualized skeletal structures are unremarkable. IMPRESSION: Increased moderate patchy bilateral airspace opacities consistent with COVID-19 pneumonia. Electronically Signed   By: Romona Curls M.D.   On: 07/10/2020 15:06        Scheduled Meds: . aspirin EC  81 mg Oral Daily  . atorvastatin  80 mg Oral Daily  . empagliflozin  25 mg Oral Daily  . enoxaparin (LOVENOX) injection  40 mg Subcutaneous QHS  . insulin aspart  0-15 Units Subcutaneous TID WC  . insulin aspart  0-5 Units Subcutaneous QHS  . insulin aspart  4 Units Subcutaneous TID WC  . insulin detemir  5 Units Subcutaneous QHS  . lisinopril  40 mg Oral Daily  . metoprolol succinate  100 mg Oral Daily  .  predniSONE  10 mg Oral 3 x daily with food  . [START ON 07/12/2020] predniSONE  10 mg Oral 4X daily taper  . predniSONE  20 mg Oral Nightly  . sodium chloride flush  3 mL Intravenous Q12H   Continuous Infusions: . sodium chloride    . remdesivir 100 mg in NS 100 mL       LOS: 1 day   Time spent:  Azucena Fallen, DO [facility]  If 7PM-7AM, please contact night-coverage [email or web address]  07/11/2020, 7:25 AM

## 2020-07-11 NOTE — ED Notes (Signed)
Pt CBG  IS 130

## 2020-07-11 NOTE — ED Notes (Signed)
CBG 245. 

## 2020-07-12 ENCOUNTER — Inpatient Hospital Stay (HOSPITAL_COMMUNITY): Payer: 59

## 2020-07-12 LAB — CBC WITH DIFFERENTIAL/PLATELET
Abs Immature Granulocytes: 0.11 10*3/uL — ABNORMAL HIGH (ref 0.00–0.07)
Basophils Absolute: 0 10*3/uL (ref 0.0–0.1)
Basophils Relative: 0 %
Eosinophils Absolute: 0 10*3/uL (ref 0.0–0.5)
Eosinophils Relative: 0 %
HCT: 45.6 % (ref 39.0–52.0)
Hemoglobin: 14.7 g/dL (ref 13.0–17.0)
Immature Granulocytes: 1 %
Lymphocytes Relative: 10 %
Lymphs Abs: 0.8 10*3/uL (ref 0.7–4.0)
MCH: 29.7 pg (ref 26.0–34.0)
MCHC: 32.2 g/dL (ref 30.0–36.0)
MCV: 92.1 fL (ref 80.0–100.0)
Monocytes Absolute: 0.6 10*3/uL (ref 0.1–1.0)
Monocytes Relative: 7 %
Neutro Abs: 6.3 10*3/uL (ref 1.7–7.7)
Neutrophils Relative %: 82 %
Platelets: 232 10*3/uL (ref 150–400)
RBC: 4.95 MIL/uL (ref 4.22–5.81)
RDW: 12.3 % (ref 11.5–15.5)
WBC: 7.8 10*3/uL (ref 4.0–10.5)
nRBC: 0 % (ref 0.0–0.2)

## 2020-07-12 LAB — CULTURE, BLOOD (ROUTINE X 2)
Culture: NO GROWTH
Culture: NO GROWTH
Special Requests: ADEQUATE
Special Requests: ADEQUATE

## 2020-07-12 LAB — COMPREHENSIVE METABOLIC PANEL
ALT: 22 U/L (ref 0–44)
AST: 27 U/L (ref 15–41)
Albumin: 3.1 g/dL — ABNORMAL LOW (ref 3.5–5.0)
Alkaline Phosphatase: 61 U/L (ref 38–126)
Anion gap: 11 (ref 5–15)
BUN: 23 mg/dL — ABNORMAL HIGH (ref 6–20)
CO2: 27 mmol/L (ref 22–32)
Calcium: 8.2 mg/dL — ABNORMAL LOW (ref 8.9–10.3)
Chloride: 102 mmol/L (ref 98–111)
Creatinine, Ser: 0.93 mg/dL (ref 0.61–1.24)
GFR calc Af Amer: >60 mL/min (ref >60)
GFR calc non Af Amer: >60 mL/min (ref >60)
Glucose, Bld: 149 mg/dL — ABNORMAL HIGH (ref 70–99)
Potassium: 4.5 mmol/L (ref 3.5–5.1)
Sodium: 140 mmol/L (ref 135–145)
Total Bilirubin: 0.8 mg/dL (ref 0.3–1.2)
Total Protein: 6 g/dL — ABNORMAL LOW (ref 6.5–8.1)

## 2020-07-12 LAB — CBG MONITORING, ED
Glucose-Capillary: 177 mg/dL — ABNORMAL HIGH (ref 70–99)
Glucose-Capillary: 245 mg/dL — ABNORMAL HIGH (ref 70–99)
Glucose-Capillary: 184 mg/dL — ABNORMAL HIGH (ref 70–99)
Glucose-Capillary: 130 mg/dL — ABNORMAL HIGH (ref 70–99)
Glucose-Capillary: 143 mg/dL — ABNORMAL HIGH (ref 70–99)
Glucose-Capillary: 130 mg/dL — ABNORMAL HIGH (ref 70–99)
Glucose-Capillary: 174 mg/dL — ABNORMAL HIGH (ref 70–99)

## 2020-07-12 LAB — D-DIMER, QUANTITATIVE: D-Dimer, Quant: 0.55 ug/mL-FEU — ABNORMAL HIGH (ref 0.00–0.50)

## 2020-07-12 LAB — C-REACTIVE PROTEIN: CRP: 4 mg/dL — ABNORMAL HIGH (ref <1.0)

## 2020-07-12 LAB — GLUCOSE, CAPILLARY: Glucose-Capillary: 168 mg/dL — ABNORMAL HIGH (ref 70–99)

## 2020-07-12 MED ORDER — FUROSEMIDE 20 MG PO TABS
20.0000 mg | ORAL_TABLET | Freq: Two times a day (BID) | ORAL | Status: AC
  Administered 2020-07-12 – 2020-07-14 (×5): 20 mg via ORAL
  Filled 2020-07-12 (×5): qty 1

## 2020-07-12 MED ORDER — METHYLPREDNISOLONE SODIUM SUCC 125 MG IJ SOLR
60.0000 mg | Freq: Four times a day (QID) | INTRAMUSCULAR | Status: DC
  Administered 2020-07-12 – 2020-07-14 (×9): 60 mg via INTRAVENOUS
  Filled 2020-07-12 (×9): qty 2

## 2020-07-12 MED ORDER — BARICITINIB 2 MG PO TABS
4.0000 mg | ORAL_TABLET | Freq: Every day | ORAL | Status: DC
  Administered 2020-07-12 – 2020-07-19 (×8): 4 mg via ORAL
  Filled 2020-07-12 (×9): qty 2

## 2020-07-12 NOTE — ED Notes (Signed)
Floor requested call back in a few minutes

## 2020-07-12 NOTE — Progress Notes (Addendum)
PROGRESS NOTE    Jake Mosley  ZYS:[id number] DOB: 06-13-1961 DOA: 07/10/2020 PCP: Elijio Miles., MD   Brief Narrative:  Jake Mosley is a 59 y.o. male past medical history of hypertension diabetes mellitus and hyperlipidemia who was recently discharged from the hospital on 07/08/2020 for COVID-19 he was discharged home and was set to finish his remdesivir treatment as an outpatient and he relates he is began getting more and more weak, at the infusion center  on 07/10/2020 he was found to be hypoxic satting 86 was placed on 2 L of oxygen and his saturations keep being 86 to he was referred to the ED. In the ED other vital signs were stable, was low yield blood glucose was 220 he had no leukocytosis, chest x-ray when unchanged from previous.  Assessment & Plan:   Active Problems:   Pneumonia due to COVID-19 virus   Essential hypertension   Acute respiratory failure with hypoxia (HCC)   Type 2 diabetes mellitus without complication (HCC)  Acute respiratory failure with hypoxia secondary to pneumonia due to COVID-19 virus: Admitted to MedSurg continue him on oxygen try to keep saturations greater than 94%, will start him on IV  Remdesivir and steroids, he was not taking steroids at home despite prescription and discussion at discharge -likely the cause of his worsening status and readmission. Will follow inflammatory markers, try to keep the patient preferably 16 hours a day if not prone out of bed to chair continue incentive spirometry and flutter valve. Procalcitonin less than 0.1 we will not start empiric antibiotics at this point. 07/12/2020 patient had acutely worsening respiratory status, repeat chest x-ray shows worsening edema -initiate low-dose Lasix x2 days - follow I's and O's D-dimer remains low-patient without tachycardia; low threshold for CTA if worsening D-dimer in the morning Baricitinib initiated 07/12/2020  SpO2: 90 % O2 Flow Rate (L/min): 15 L/min (6L humidified [place];  15 NRB)  Recent Labs    07/10/20 1505 07/11/20 0500 07/12/20 0436  DDIMER 0.55* 0.60* 0.55*  FERRITIN 663*  --   --   LDH 327*  --   --   CRP 5.1* 7.7* 4.0*    Lab Results  Component Value Date   SARSCOV2NAA POSITIVE (A) 07/07/2020    Essential hypertension: Continue current home regimen.  Type 2 diabetes mellitus without complication (HCC): Hypoglycemic agent except for Metformin, will start on long-acting insulin plus sliding scale.   DVT Prophylaxis: lovenox Code Status: full  Family Communication: Updated over the phone  Status is: Inpatient  Dispo: The patient is from: Home              Anticipated d/c is to: Home              Anticipated d/c date is: 24 to 48 hours              Patient currently not medically stable for discharge given ongoing acute hypoxic respiratory failure with symptomatic dyspnea in the setting of COVID-19 pneumonia  Consultants:   None  Procedures:   None  Antimicrobials:  - Remdesivir  Subjective: Overnight acutely hypoxic, patient without tachycardia chest pain edema or other symptoms but notes worsening dyspnea with exertion most notably but now worsening dyspnea even at rest.  Improved with nonrebreather and Salter nasal cannula.  Denies nausea, vomiting, diarrhea, constipation, headache, fevers, chills.  Objective: Vitals:   07/12/20 1315 07/12/20 1406 07/12/20 1442 07/12/20 1544  BP: 101/74 116/75 (!) 109/53 (!) 109/53  Pulse: (!) 56 (!)  54 (!) 48 (!) 52  Resp: (!) 21 (!) 22 (!) 21 20  Temp:      TempSrc:      SpO2: 92% 91% 90% 90%  Weight:      Height:       No intake or output data in the 24 hours ending 07/12/20 1645 Filed Weights   07/11/20 1948  Weight: 119.7 kg    Examination:  General exam: Appears calm and comfortable. Respiratory system: Clear to auscultation. Respiratory effort normal. Cardiovascular system: S1 & S2 heard, RRR. No JVD, murmurs, rubs, gallops or clicks. No pedal  edema. Gastrointestinal system: Abdomen is nondistended, soft and nontender. No organomegaly or masses felt. Normal bowel sounds heard. Central nervous system: Alert and oriented. No focal neurological deficits. Extremities: Symmetric 5 x 5 power. Skin: No rashes, lesions or ulcers. Psychiatry: Judgement and insight appear normal. Mood & affect appropriate.  Data Reviewed: I have personally reviewed following labs and imaging studies  CBC: Recent Labs  Lab 07/07/20 1638 07/07/20 1638 07/07/20 2313 07/08/20 0243 07/10/20 1505 07/11/20 0500 07/12/20 0436  WBC 4.2   < > 3.3* 3.2* 7.7 6.3 7.8  NEUTROABS 3.4  --   --  2.2 6.7 5.2 6.3  HGB 15.1   < > 13.9 14.1 14.9 14.4 14.7  HCT 46.5   < > 42.7 43.7 44.9 43.9 45.6  MCV 91.9   < > 93.0 94.8 91.1 91.5 92.1  PLT 136*   < > 124* 124* 187 198 232   < > = values in this interval not displayed.   Basic Metabolic Panel: Recent Labs  Lab 07/07/20 1638 07/07/20 1638 07/07/20 2313 07/08/20 0243 07/10/20 1505 07/11/20 0500 07/12/20 0436  NA 135  --   --  136 140 140 140  K 4.0  --   --  4.5 4.2 4.0 4.5  CL 97*  --   --  102 102 101 102  CO2 26  --   --  GLUCOSE 188*  --   --  183* 225* 220* 149*  BUN 16  --   --  19 24* 22* 23*  CREATININE 1.21   < > 1.17 1.11 0.88 0.95 0.93  CALCIUM 7.9*  --   --  7.4* 8.1* 8.1* 8.2*  MG  --   --   --  2.0  --   --   --    < > = values in this interval not displayed.   GFR: Estimated Creatinine Clearance: 107.3 mL/min (by C-G formula based on SCr of 0.93 mg/dL). Liver Function Tests: Recent Labs  Lab 07/07/20 1638 07/08/20 0243 07/10/20 1505 07/11/20 0500 07/12/20 0436  AST 40 32 59* 45* 27  ALT ALKPHOS 72 64 67 64 61  BILITOT 0.4 0.5 0.8 1.0 0.8  PROT 6.7 6.1* 6.0* 6.0* 6.0*  ALBUMIN 3.4* 3.0* 3.0* 3.1* 3.1*   No results for input(s): LIPASE, AMYLASE in the last 168 hours. No results for input(s): AMMONIA in the last 168 hours. Coagulation  Profile: Recent Labs  Lab 07/07/20 1638  INR 1.0   Cardiac Enzymes: No results for input(s): CKTOTAL, CKMB, CKMBINDEX, TROPONINI in the last 168 hours. BNP (last 3 results) No results for input(s): PROBNP in the last 8760 hours. HbA1C: No results for input(s): HGBA1C in the last 72 hours. CBG: Recent Labs  Lab 07/11/20 1148 07/11/20 1711 07/11/20 2214 07/12/20 0843 07/12/20 1123  GLUCAP 184* 130*  143* 130* 174*   Lipid Profile: Recent Labs    07/10/20 1505  TRIG 91   Thyroid Function Tests: No results for input(s): TSH, T4TOTAL, FREET4, T3FREE, THYROIDAB in the last 72 hours. Anemia Panel: Recent Labs    07/10/20 1505  FERRITIN 663*   Sepsis Labs: Recent Labs  Lab 07/07/20 1638 07/07/20 1848 07/10/20 1505  PROCALCITON <0.10  --  <0.10  LATICACIDVEN 2.1* 2.0* 1.9    Recent Results (from the past 240 hour(s))  Blood Culture (routine x 2)     Status: None   Collection Time: 07/07/20  4:38 PM   Specimen: BLOOD  Result Value Ref Range Status   Specimen Description   Final    BLOOD RIGHT ANTECUBITAL Performed at [facility] [place], [address] [place], [place] [address]    Special Requests   Final    BOTTLES DRAWN AEROBIC AND ANAEROBIC Blood Culture adequate volume Performed at [facility] [place], [address] [place], [place] [address]    Culture   Final    NO GROWTH 5 DAYS Performed at [facility], [address] [place], [place] [address]    Report Status 07/12/2020 FINAL  Final  SARS Coronavirus 2 by RT PCR (hospital order, performed in [facility] lab) Nasopharyngeal Nasopharyngeal Swab     Status: Abnormal   Collection Time: 07/07/20  4:38 PM   Specimen: Nasopharyngeal Swab  Result Value Ref Range Status   SARS Coronavirus 2 POSITIVE (A) NEGATIVE Final    Comment: RESULT CALLED TO, READ BACK BY AND VERIFIED WITH: MARCUM RUTH RN AT 1740 ON 07/07/20 BY [email or web address] (NOTE) SARS-CoV-2 target nucleic acids are  DETECTED  SARS-CoV-2 RNA is generally detectable in upper respiratory specimens  during the acute phase of infection.  Positive results are indicative  of the presence of the identified virus, but do not rule out bacterial infection or co-infection with other pathogens not detected by the test.  Clinical correlation with patient history and  other diagnostic information is necessary to determine patient infection status.  The expected result is negative.  Fact Sheet for Patients:   BoilerBrush.com.cyhttps://www.fda.gov/media/136312/download   Fact Sheet for Healthcare Providers:   https://pope.com/https://www.fda.gov/media/136313/download    This test is not yet approved or cleared by the [place] FDA and  has been authorized for detection and/or diagnosis of SARS-CoV-2 by FDA under an Emergency Use Authorization (EUA).  This EUA will remain in effect (meani ng this test can be used) for the duration of  the COVID-19 declaration under Section 564(b)(1) of the Act, 21 U.S.C. section 360-bbb-3(b)(1), unless the authorization is terminated or revoked sooner.  Performed at [facility] [place], [address] [place], [place] [address]   Blood Culture (routine x 2)     Status: None   Collection Time: 07/07/20  4:58 PM   Specimen: BLOOD  Result Value Ref Range Status   Specimen Description   Final    BLOOD BLOOD RIGHT HAND Performed at [facility] [place], [address] [place], [place] [address]    Special Requests   Final    BOTTLES DRAWN AEROBIC AND ANAEROBIC Blood Culture adequate volume Performed at [facility] [place], [address] [place], [place] [address]    Culture   Final    NO GROWTH 5 DAYS Performed at [facility], [address] [place], [place] [address]    Report Status 07/12/2020 FINAL  Final  Respiratory Panel by PCR     Status: None   Collection Time: 07/07/20 10:32 PM   Specimen: Nasopharyngeal Swab; Respiratory  Result Value Ref Range Status   Adenovirus NOT  DETECTED NOT DETECTED Final   Coronavirus 229E NOT DETECTED NOT DETECTED Final    Comment: (NOTE) The Coronavirus on the Respiratory Panel, DOES NOT test for the novel  Coronavirus (2019 nCoV)    Coronavirus HKU1 NOT DETECTED NOT DETECTED Final   Coronavirus NL63 NOT DETECTED NOT DETECTED Final   Coronavirus OC43 NOT DETECTED NOT DETECTED Final   Metapneumovirus NOT DETECTED NOT DETECTED Final   Rhinovirus / Enterovirus NOT DETECTED NOT DETECTED Final   Influenza A NOT DETECTED NOT DETECTED Final   Influenza B NOT DETECTED NOT DETECTED Final   Parainfluenza Virus 1 NOT DETECTED NOT DETECTED Final   Parainfluenza Virus 2 NOT DETECTED NOT DETECTED Final   Parainfluenza Virus 3 NOT DETECTED NOT DETECTED Final   Parainfluenza Virus 4 NOT DETECTED NOT DETECTED Final   Respiratory Syncytial Virus NOT DETECTED NOT DETECTED Final   Bordetella pertussis NOT DETECTED NOT DETECTED Final   Chlamydophila pneumoniae NOT DETECTED NOT DETECTED Final   Mycoplasma pneumoniae NOT DETECTED NOT DETECTED Final    Comment: Performed at [facility], [address], [place], [place] [address]  Culture, blood (Routine X 2) w Reflex to ID Panel     Status: None (Preliminary result)   Collection Time: 07/07/20 11:13 PM   Specimen: BLOOD  Result Value Ref Range Status   Specimen Description   Final    BLOOD BLOOD LEFT HAND Performed at [facility], [address] [place], [place] [address]    Special Requests   Final    BOTTLES DRAWN AEROBIC ONLY Blood Culture adequate volume Performed at [facility], [address], [place], [place] [address]    Culture   Final    NO GROWTH 4 DAYS Performed at [facility], [address], [place], [place] [address]    Report Status PENDING  Incomplete  Culture, blood (Routine X 2) w Reflex to ID Panel     Status: None (Preliminary result)   Collection Time: 07/07/20 11:13 PM   Specimen: BLOOD  Result Value Ref  Range Status   Specimen Description   Final    BLOOD RIGHT ANTECUBITAL Performed at [facility], [address], [place], [place] [address]    Special Requests   Final    BOTTLES DRAWN AEROBIC AND ANAEROBIC Blood Culture adequate volume Performed at [facility], [address] [place], [place] [address]    Culture   Final    NO GROWTH 4 DAYS Performed at [facility], [address], [place], [place] [address]    Report Status PENDING  Incomplete  Urine culture     Status: None   Collection Time: 07/08/20  2:17 AM   Specimen: In/Out Cath Urine  Result Value Ref Range Status   Specimen Description   Final    IN/OUT CATH URINE Performed at [facility], [address] [place], [place] [address]    Special Requests   Final    NONE Performed at [facility], [address] [place], [place] [address]    Culture   Final    NO GROWTH Performed at [facility], [address], [place], [place] [address]    Report  Status 07/08/2020 FINAL  Final  Blood Culture (routine x 2)     Status: None (Preliminary result)   Collection Time: 07/10/20  3:14 PM   Specimen: BLOOD  Result Value Ref Range Status   Specimen Description   Final    BLOOD LEFT ANTECUBITAL Performed at [facility], [address] [place], [place] [address]    Special Requests   Final    BOTTLES DRAWN AEROBIC AND ANAEROBIC Blood Culture adequate volume Performed at [facility], [address], [place], [place] [address]    Culture   Final    NO GROWTH 2 DAYS Performed at [facility], [address], [place], [place] [address]    Report Status PENDING  Incomplete  Blood Culture (routine x 2)     Status: None (Preliminary result)   Collection Time: 07/10/20  3:15 PM   Specimen: BLOOD LEFT HAND  Result Value Ref Range Status   Specimen Description   Final    BLOOD LEFT HAND Performed at [facility], [address] [place], [place] [address]    Special Requests   Final    BOTTLES DRAWN AEROBIC AND ANAEROBIC Blood Culture results may not be optimal due to an excessive volume of blood received in culture bottles Performed at [facility], [address], [place], [place] [address]    Culture   Final    NO GROWTH 2 DAYS Performed at [facility], [address] [place], [place] [address]    Report Status PENDING  Incomplete         Radiology Studies: DG Chest Port 1 View  Result Date: 07/12/2020 CLINICAL DATA:  Hypoxia EXAM: PORTABLE CHEST 1 VIEW COMPARISON:  07/10/2020 chest radiograph and prior. FINDINGS: Decreased conspicuity of multifocal bilateral patchy pulmonary opacities. No pneumothorax or pleural effusion. No new focal consolidation. Cardiomediastinal silhouette within normal limits. No acute osseous abnormality. IMPRESSION: Multifocal patchy opacities, less conspicuous than prior exam. Electronically Signed   By: Stana Bunting M.D.   On: 07/12/2020 11:50        Scheduled Meds: . aspirin EC  81 mg Oral Daily  . atorvastatin  80 mg Oral Daily  . baricitinib  4 mg Oral Daily  . empagliflozin  25 mg Oral Daily  . enoxaparin (LOVENOX) injection  60 mg Subcutaneous QHS  . insulin aspart  0-15 Units Subcutaneous TID WC  . insulin aspart  0-5 Units Subcutaneous QHS  . insulin aspart  4 Units Subcutaneous TID WC  . insulin detemir  5 Units Subcutaneous QHS  . lisinopril  40 mg Oral Daily  . methylPREDNISolone (SOLU-MEDROL) injection  60 mg Intravenous Q6H  . metoprolol succinate  100 mg Oral Daily  . sodium chloride flush  3 mL Intravenous Q12H   Continuous Infusions: . sodium chloride       LOS: 2 days   Time spent:  Azucena Fallen, DO [facility]  If 7PM-7AM, please contact night-coverage [email or web address]  07/12/2020, 4:45 PM

## 2020-07-12 NOTE — ED Notes (Signed)
Patient received breakfast tray 

## 2020-07-12 NOTE — ED Notes (Addendum)
Attempted to call report, floor requested call at purple man time ([phone])

## 2020-07-12 NOTE — ED Notes (Signed)
CBG 130 taken by NT [facility]

## 2020-07-12 NOTE — ED Notes (Signed)
Jake Mosley messaged due to pt's increased oxygen need, provider changed pt's bed status from med-surg to telemetry

## 2020-07-12 NOTE — ED Notes (Signed)
CBG 174 taken by NT [facility]

## 2020-07-12 NOTE — ED Notes (Signed)
Patient received lunch tray 

## 2020-07-12 NOTE — Progress Notes (Signed)
Pharmacy Note  59 y/o M with COVID PNA. Pharmacy consulted to dose baricitinib.   O: eGFR 107  A/P: Renal function is adequate for baricitinib 4 mg daily x 14 days. Will order as such and sign off. Will continue to follow remotely.   Thank you for the consult.   Ulice Dash, PharmD, BCPS

## 2020-07-13 LAB — CULTURE, BLOOD (ROUTINE X 2)
Culture: NO GROWTH
Culture: NO GROWTH
Special Requests: ADEQUATE
Special Requests: ADEQUATE

## 2020-07-13 LAB — COMPREHENSIVE METABOLIC PANEL
ALT: 18 U/L (ref 0–44)
AST: 21 U/L (ref 15–41)
Albumin: 2.9 g/dL — ABNORMAL LOW (ref 3.5–5.0)
Alkaline Phosphatase: 61 U/L (ref 38–126)
Anion gap: 13 (ref 5–15)
BUN: 26 mg/dL — ABNORMAL HIGH (ref 6–20)
CO2: 23 mmol/L (ref 22–32)
Calcium: 8.2 mg/dL — ABNORMAL LOW (ref 8.9–10.3)
Chloride: 103 mmol/L (ref 98–111)
Creatinine, Ser: 0.94 mg/dL (ref 0.61–1.24)
GFR calc Af Amer: >60 mL/min (ref >60)
GFR calc non Af Amer: >60 mL/min (ref >60)
Glucose, Bld: 162 mg/dL — ABNORMAL HIGH (ref 70–99)
Potassium: 4.5 mmol/L (ref 3.5–5.1)
Sodium: 139 mmol/L (ref 135–145)
Total Bilirubin: 0.9 mg/dL (ref 0.3–1.2)
Total Protein: 5.9 g/dL — ABNORMAL LOW (ref 6.5–8.1)

## 2020-07-13 LAB — CBC WITH DIFFERENTIAL/PLATELET
Abs Immature Granulocytes: 0.1 10*3/uL — ABNORMAL HIGH (ref 0.00–0.07)
Basophils Absolute: 0 10*3/uL (ref 0.0–0.1)
Basophils Relative: 0 %
Eosinophils Absolute: 0 10*3/uL (ref 0.0–0.5)
Eosinophils Relative: 0 %
HCT: 45.4 % (ref 39.0–52.0)
Hemoglobin: 14.8 g/dL (ref 13.0–17.0)
Immature Granulocytes: 2 %
Lymphocytes Relative: 12 %
Lymphs Abs: 0.7 10*3/uL (ref 0.7–4.0)
MCH: 30.1 pg (ref 26.0–34.0)
MCHC: 32.6 g/dL (ref 30.0–36.0)
MCV: 92.3 fL (ref 80.0–100.0)
Monocytes Absolute: 0.3 10*3/uL (ref 0.1–1.0)
Monocytes Relative: 4 %
Neutro Abs: 4.8 10*3/uL (ref 1.7–7.7)
Neutrophils Relative %: 82 %
Platelets: 243 10*3/uL (ref 150–400)
RBC: 4.92 MIL/uL (ref 4.22–5.81)
RDW: 12.2 % (ref 11.5–15.5)
WBC: 5.9 10*3/uL (ref 4.0–10.5)
nRBC: 0 % (ref 0.0–0.2)

## 2020-07-13 LAB — GLUCOSE, CAPILLARY
Glucose-Capillary: 226 mg/dL — ABNORMAL HIGH (ref 70–99)
Glucose-Capillary: 233 mg/dL — ABNORMAL HIGH (ref 70–99)
Glucose-Capillary: 290 mg/dL — ABNORMAL HIGH (ref 70–99)
Glucose-Capillary: 323 mg/dL — ABNORMAL HIGH (ref 70–99)
Glucose-Capillary: 273 mg/dL — ABNORMAL HIGH (ref 70–99)

## 2020-07-13 LAB — C-REACTIVE PROTEIN: CRP: 1.6 mg/dL — ABNORMAL HIGH (ref <1.0)

## 2020-07-13 LAB — D-DIMER, QUANTITATIVE: D-Dimer, Quant: 0.46 ug/mL-FEU (ref 0.00–0.50)

## 2020-07-13 MED ORDER — ADULT MULTIVITAMIN W/MINERALS CH
1.0000 | ORAL_TABLET | Freq: Every day | ORAL | Status: DC
  Administered 2020-07-13 – 2020-07-22 (×10): 1 via ORAL
  Filled 2020-07-13 (×10): qty 1

## 2020-07-13 MED ORDER — KATE FARMS STANDARD 1.4 PO LIQD
325.0000 mL | Freq: Every day | ORAL | Status: DC
  Administered 2020-07-13 – 2020-07-20 (×5): 325 mL via ORAL
  Filled 2020-07-13 (×10): qty 325

## 2020-07-13 MED ORDER — ACETAMINOPHEN 325 MG PO TABS
650.0000 mg | ORAL_TABLET | Freq: Four times a day (QID) | ORAL | Status: DC | PRN
  Administered 2020-07-13 – 2020-07-21 (×7): 650 mg via ORAL
  Filled 2020-07-13 (×8): qty 2

## 2020-07-13 MED ORDER — PROSOURCE PLUS PO LIQD
30.0000 mL | Freq: Two times a day (BID) | ORAL | Status: DC
  Administered 2020-07-13 – 2020-07-22 (×18): 30 mL via ORAL
  Filled 2020-07-13 (×18): qty 30

## 2020-07-13 NOTE — Progress Notes (Signed)
Initial Nutrition Assessment  RD working remotely.  DOCUMENTATION CODES:   Morbid obesity  INTERVENTION:  - will order [employer] 1.4 po once/day, each supplement provides 455 kcal and 20 grams protein. - will order 30 mL Prosource Plus BID, each supplement provides 100 kcal and 15 grams of protein. - will order 1 tablet multivitamin with minerals/day.   NUTRITION DIAGNOSIS:   Increased nutrient needs related to acute illness, catabolic illness (COVID-19 infection) as evidenced by estimated needs  GOAL:   Patient will meet greater than or equal to 90% of their needs  MONITOR:   PO intake, Supplement acceptance, Labs, Weight trends  REASON FOR ASSESSMENT:   Malnutrition Screening Tool  ASSESSMENT:   59 y.o. male with medical history of HTN, DM, and HLD. He was discharged on 07/08/20 following treatment for COVID-19. Patient presented to the ED d/t progressive weakness and hypoxia while at outpatient infusion center on 8/14.  No intakes documented since admission. Weight on 8/15 was 264 lb and weight on 8/11 was 259 lb. No other weight hx available in the chart.   Per notes: - acute hypoxia 2/2 COVID-19 PNA--non-compliance with steroids at home   Labs reviewed; CBG: 233 mg/dl, BUN: 26 mg/dl. Medications reviewed; 20 mg oral lasix BID, sliding scale novolog, 4 units novolog TID, 5 units levemir/day, 60 mg solu-medrol QID.     NUTRITION - FOCUSED PHYSICAL EXAM:  unable to complete at this time.   Diet Order:   Diet Order            Diet regular Room service appropriate? Yes; Fluid consistency: Thin  Diet effective now                 EDUCATION NEEDS:   No education needs have been identified at this time  Skin:  Skin Assessment: Reviewed RN Assessment  Last BM:  8/16  Height:   Ht Readings from Last 1 Encounters:  07/11/20 5\' 7"  (1.702 m)    Weight:   Wt Readings from Last 1 Encounters:  07/11/20 119.7 kg     Estimated Nutritional Needs:  Kcal:   2000-2200 kcal Protein:  100-110 grams Fluid:  >/= 2 L/day     07/13/20, MS, RD, LDN, CNSC Inpatient Clinical Dietitian RD pager # available in AMION  After hours/weekend pager # available in [facility]

## 2020-07-13 NOTE — Progress Notes (Signed)
PROGRESS NOTE    Jake Mosley  ZOX:[id number] DOB: 05-Apr-1961 DOA: 07/10/2020 PCP: Elijio Miles., MD   Brief Narrative:  Jake Mosley is a 59 y.o. male past medical history of hypertension diabetes mellitus and hyperlipidemia who was recently discharged from the hospital on 07/08/2020 for COVID-19 he was discharged home and was set to finish his remdesivir treatment as an outpatient and he relates he is began getting more and more weak, at the infusion center  on 07/10/2020 he was found to be hypoxic satting 86 was placed on 2 L of oxygen and his saturations keep being 86 to he was referred to the ED. In the ED other vital signs were stable, was low yield blood glucose was 220 he had no leukocytosis, chest x-ray when unchanged from previous.  Assessment & Plan:   Active Problems:   Pneumonia due to COVID-19 virus   Essential hypertension   Acute respiratory failure with hypoxia (HCC)   Type 2 diabetes mellitus without complication (HCC)   Acute respiratory failure with hypoxia secondary to pneumonia due to COVID-19 virus: Admitted to MedSurg continue him on oxygen try to keep saturations greater than 94%, will start him on IV  Remdesivir and steroids, he was not taking steroids at home despite prescription and discussion at discharge -likely the cause of his worsening status and readmission. Will follow inflammatory markers, try to keep the patient preferably 16 hours a day if not prone out of bed to chair continue incentive spirometry and flutter valve. Procalcitonin less than 0.1 we will not start empiric antibiotics at this point. 07/12/2020 patient had acutely worsening respiratory status, repeat chest x-ray shows worsening edema -initiate low-dose Lasix x2 days stop 18th - follow I's and O's D-dimer remains low-patient without tachycardia; low threshold for CTA if worsening D-dimer in the morning Baricitinib initiated 07/12/2020  SpO2: 90 % O2 Flow Rate (L/min): 15  L/min  Recent Labs    07/10/20 1505 07/10/20 1505 07/11/20 0500 07/12/20 0436 07/13/20 0452  DDIMER 0.55*   < > 0.60* 0.55* 0.46  FERRITIN 663*  --   --   --   --   LDH 327*  --   --   --   --   CRP 5.1*   < > 7.7* 4.0* 1.6*   < > = values in this interval not displayed.   Lab Results  Component Value Date   SARSCOV2NAA POSITIVE (A) 07/07/2020   Essential hypertension: Continue current home regimen.  Type 2 diabetes mellitus without complication (HCC): Hypoglycemic agent except for Metformin, will start on long-acting insulin plus sliding scale.   DVT Prophylaxis: lovenox Code Status: full  Family Communication: Updated over the phone  Status is: Inpatient  Dispo: The patient is from: Home              Anticipated d/c is to: Home              Anticipated d/c date is: 24 to 48 hours              Patient currently not medically stable for discharge given ongoing acute hypoxic respiratory failure with symptomatic dyspnea in the setting of COVID-19 pneumonia  Consultants:   None  Procedures:   None  Antimicrobials:  - Remdesivir  Subjective: No acute issues or events overnight, patient stabilizing somewhat on oxygen, able to tolerate 15 L nasal cannula now with no further desaturations although ongoing dyspnea even with minimal exertion. Denies nausea, vomiting, diarrhea, constipation, headache, fevers,  chills.  Objective: Vitals:   07/13/20 0604 07/13/20 1216 07/13/20 1254 07/13/20 1259  BP: (!) 152/86 116/72    Pulse: (!) 55 63    Resp: 19 (!) 24    Temp: (!) 100.5 F (38.1 C) 98.5 F (36.9 C)    TempSrc: Oral Oral    SpO2: (!) 83% (!) 83% (!) 86% 90%  Weight:      Height:       No intake or output data in the 24 hours ending 07/13/20 1400 Filed Weights   07/11/20 1948  Weight: 119.7 kg    Examination:  General:  Pleasantly resting in bed, No acute distress. HEENT:  Normocephalic atraumatic.  Sclerae nonicteric, noninjected.  Extraocular  movements intact bilaterally. Neck:  Without mass or deformity.  Trachea is midline. Lungs: Diminished breath sounds bilaterally with poor inspiratory effort: Without overt rhonchi wheeze or rales. Heart:  Regular rate and rhythm.  Without murmurs, rubs, or gallops. Abdomen:  Soft, nontender, nondistended.  Without guarding or rebound. Extremities: Without cyanosis, clubbing, edema, or obvious deformity. Vascular:  Dorsalis pedis and posterior tibial pulses palpable bilaterally. Skin:  Warm and dry, no erythema, no ulcerations.  Data Reviewed: I have personally reviewed following labs and imaging studies  CBC: Recent Labs  Lab 07/08/20 0243 07/10/20 1505 07/11/20 0500 07/12/20 0436 07/13/20 0452  WBC 3.2* 7.7 6.3 7.8 5.9  NEUTROABS 2.2 6.7 5.2 6.3 4.8  HGB 14.1 14.9 14.4 14.7 14.8  HCT 43.7 44.9 43.9 45.6 45.4  MCV 94.8 91.1 91.5 92.1 92.3  PLT 124* 187 198 232 243   Basic Metabolic Panel: Recent Labs  Lab 07/08/20 0243 07/10/20 1505 07/11/20 0500 07/12/20 0436 07/13/20 0452  NA 136 140 140 140 139  K 4.5 4.2 4.0 4.5 4.5  CL 102 102 101 102 103  CO2 26 27 27 27 23   GLUCOSE 183* 225* 220* 149* 162*  BUN 19 24* 22* 23* 26*  CREATININE 1.11 0.88 0.95 0.93 0.94  CALCIUM 7.4* 8.1* 8.1* 8.2* 8.2*  MG 2.0  --   --   --   --    GFR: Estimated Creatinine Clearance: 106.1 mL/min (by C-G formula based on SCr of 0.94 mg/dL). Liver Function Tests: Recent Labs  Lab 07/08/20 0243 07/10/20 1505 07/11/20 0500 07/12/20 0436 07/13/20 0452  AST 32 59* 45* 27 21  ALT 22 30 27 22 18   ALKPHOS 64 67 64 61 61  BILITOT 0.5 0.8 1.0 0.8 0.9  PROT 6.1* 6.0* 6.0* 6.0* 5.9*  ALBUMIN 3.0* 3.0* 3.1* 3.1* 2.9*   No results for input(s): LIPASE, AMYLASE in the last 168 hours. No results for input(s): AMMONIA in the last 168 hours. Coagulation Profile: Recent Labs  Lab 07/07/20 1638  INR 1.0   Cardiac Enzymes: No results for input(s): CKTOTAL, CKMB, CKMBINDEX, TROPONINI in the last  168 hours. BNP (last 3 results) No results for input(s): PROBNP in the last 8760 hours. HbA1C: No results for input(s): HGBA1C in the last 72 hours. CBG: Recent Labs  Lab 07/11/20 2214 07/12/20 0843 07/12/20 1123 07/12/20 1650 07/13/20 0812  GLUCAP 143* 130* 174* 168* 233*   Lipid Profile: Recent Labs    07/10/20 1505  TRIG 91   Thyroid Function Tests: No results for input(s): TSH, T4TOTAL, FREET4, T3FREE, THYROIDAB in the last 72 hours. Anemia Panel: Recent Labs    07/10/20 1505  FERRITIN 663*   Sepsis Labs: Recent Labs  Lab 07/07/20 1638 07/07/20 1848 07/10/20 1505  PROCALCITON <0.10  --  <  0.10  LATICACIDVEN 2.1* 2.0* 1.9    Recent Results (from the past 240 hour(s))  Blood Culture (routine x 2)     Status: None   Collection Time: 07/07/20  4:38 PM   Specimen: BLOOD  Result Value Ref Range Status   Specimen Description   Final    BLOOD RIGHT ANTECUBITAL Performed at [facility] [place], [address] [place], [place] [address]    Special Requests   Final    BOTTLES DRAWN AEROBIC AND ANAEROBIC Blood Culture adequate volume Performed at [facility] [place], [address] [place], [place] [address]    Culture   Final    NO GROWTH 5 DAYS Performed at [facility], [address] [place], [place] [address]    Report Status 07/12/2020 FINAL  Final  SARS Coronavirus 2 by RT PCR (hospital order, performed in [facility] lab) Nasopharyngeal Nasopharyngeal Swab     Status: Abnormal   Collection Time: 07/07/20  4:38 PM   Specimen: Nasopharyngeal Swab  Result Value Ref Range Status   SARS Coronavirus 2 POSITIVE (A) NEGATIVE Final    Comment: RESULT CALLED TO, READ BACK BY AND VERIFIED WITH: MARCUM RUTH RN AT 1740 ON 07/07/20 BY I.[email or web address] (NOTE) SARS-CoV-2 target nucleic acids are DETECTED  SARS-CoV-2 RNA is generally detectable in upper respiratory specimens  during the acute phase of infection.  Positive results are indicative  of  the presence of the identified virus, but do not rule out bacterial infection or co-infection with other pathogens not detected by the test.  Clinical correlation with patient history and  other diagnostic information is necessary to determine patient infection status.  The expected result is negative.  Fact Sheet for Patients:   BoilerBrush.com.cyhttps://www.fda.gov/media/136312/download   Fact Sheet for Healthcare Providers:   https://pope.com/https://www.fda.gov/media/136313/download    This test is not yet approved or cleared by the [place] FDA and  has been authorized for detection and/or diagnosis of SARS-CoV-2 by FDA under an Emergency Use Authorization (EUA).  This EUA will remain in effect (meani ng this test can be used) for the duration of  the COVID-19 declaration under Section 564(b)(1) of the Act, 21 U.S.C. section 360-bbb-3(b)(1), unless the authorization is terminated or revoked sooner.  Performed at [facility] [place], [address] [place], [place] [address]   Blood Culture (routine x 2)     Status: None   Collection Time: 07/07/20  4:58 PM   Specimen: BLOOD  Result Value Ref Range Status   Specimen Description   Final    BLOOD BLOOD RIGHT HAND Performed at [facility] [place], [address] [place], [place] [address]    Special Requests   Final    BOTTLES DRAWN AEROBIC AND ANAEROBIC Blood Culture adequate volume Performed at [facility] [place], [address] [place], [place] [address]    Culture   Final    NO GROWTH 5 DAYS Performed at [facility], [address] [place], [place] [address]    Report Status 07/12/2020 FINAL  Final  Respiratory Panel by PCR     Status: None   Collection Time: 07/07/20 10:32 PM   Specimen: Nasopharyngeal Swab; Respiratory  Result Value Ref Range Status   Adenovirus NOT DETECTED NOT DETECTED Final   Coronavirus 229E NOT DETECTED NOT DETECTED Final    Comment: (NOTE) The Coronavirus on the Respiratory Panel, DOES NOT test  for the novel  Coronavirus (  2019 nCoV)    Coronavirus HKU1 NOT DETECTED NOT DETECTED Final   Coronavirus NL63 NOT DETECTED NOT DETECTED Final   Coronavirus OC43 NOT DETECTED NOT DETECTED Final   Metapneumovirus NOT DETECTED NOT DETECTED Final   Rhinovirus / Enterovirus NOT DETECTED NOT DETECTED Final   Influenza A NOT DETECTED NOT DETECTED Final   Influenza B NOT DETECTED NOT DETECTED Final   Parainfluenza Virus 1 NOT DETECTED NOT DETECTED Final   Parainfluenza Virus 2 NOT DETECTED NOT DETECTED Final   Parainfluenza Virus 3 NOT DETECTED NOT DETECTED Final   Parainfluenza Virus 4 NOT DETECTED NOT DETECTED Final   Respiratory Syncytial Virus NOT DETECTED NOT DETECTED Final   Bordetella pertussis NOT DETECTED NOT DETECTED Final   Chlamydophila pneumoniae NOT DETECTED NOT DETECTED Final   Mycoplasma pneumoniae NOT DETECTED NOT DETECTED Final    Comment: Performed at [facility], [address] [place], [place] [address]  Culture, blood (Routine X 2) w Reflex to ID Panel     Status: None   Collection Time: 07/07/20 11:13 PM   Specimen: BLOOD  Result Value Ref Range Status   Specimen Description   Final    BLOOD BLOOD LEFT HAND Performed at [facility], [address] [place], [place] [address]    Special Requests   Final    BOTTLES DRAWN AEROBIC ONLY Blood Culture adequate volume Performed at [facility], [address], [place], [place] [address]    Culture   Final    NO GROWTH 5 DAYS Performed at [facility], [address], [place], [place] [address]    Report Status 07/13/2020 FINAL  Final  Culture, blood (Routine X 2) w Reflex to ID Panel     Status: None   Collection Time: 07/07/20 11:13 PM   Specimen: BLOOD  Result Value Ref Range Status   Specimen Description   Final    BLOOD RIGHT ANTECUBITAL Performed at [facility], [address] [place], [place] [address]    Special Requests   Final     BOTTLES DRAWN AEROBIC AND ANAEROBIC Blood Culture adequate volume Performed at [facility], [address] [place], [place] [address]    Culture   Final    NO GROWTH 5 DAYS Performed at [facility], [address], [place], [place] [address]    Report Status 07/13/2020 FINAL  Final  Urine culture     Status: None   Collection Time: 07/08/20  2:17 AM   Specimen: In/Out Cath Urine  Result Value Ref Range Status   Specimen Description   Final    IN/OUT CATH URINE Performed at [facility], [address] [place], [place] [address]    Special Requests   Final    NONE Performed at [facility], [address] [place], [place] [address]    Culture   Final    NO GROWTH Performed at [facility], [address], [place], [place] [address]    Report Status 07/08/2020 FINAL  Final  Blood Culture (routine x 2)     Status: None (Preliminary result)   Collection Time: 07/10/20  3:14 PM   Specimen: BLOOD  Result Value Ref Range Status   Specimen Description   Final    BLOOD LEFT ANTECUBITAL Performed at [facility], [address], [place], [place] [address]    Special Requests  Final    BOTTLES DRAWN AEROBIC AND ANAEROBIC Blood Culture adequate volume Performed at [facility], [address] [place], [place] [address]    Culture   Final    NO GROWTH 3 DAYS Performed at [facility], [address], [place], [place] [address]    Report Status PENDING  Incomplete  Blood Culture (routine x 2)     Status: None (Preliminary result)   Collection Time: 07/10/20  3:15 PM   Specimen: BLOOD LEFT HAND  Result Value Ref Range Status   Specimen Description   Final    BLOOD LEFT HAND Performed at [facility], [address] [place], [place] [address]    Special Requests   Final    BOTTLES DRAWN AEROBIC AND ANAEROBIC Blood Culture results may not be optimal due to an excessive  volume of blood received in culture bottles Performed at [facility], [address] [place], [place] [address]    Culture   Final    NO GROWTH 3 DAYS Performed at [facility], [address], [place], [place] [address]    Report Status PENDING  Incomplete         Radiology Studies: DG Chest Port 1 View  Result Date: 07/12/2020 CLINICAL DATA:  Hypoxia EXAM: PORTABLE CHEST 1 VIEW COMPARISON:  07/10/2020 chest radiograph and prior. FINDINGS: Decreased conspicuity of multifocal bilateral patchy pulmonary opacities. No pneumothorax or pleural effusion. No new focal consolidation. Cardiomediastinal silhouette within normal limits. No acute osseous abnormality. IMPRESSION: Multifocal patchy opacities, less conspicuous than prior exam. Electronically Signed   By: Stana Bunting M.D.   On: 07/12/2020 11:50        Scheduled Meds: . (feeding supplement) PROSource Plus  30 mL Oral BID BM  . aspirin EC  81 mg Oral Daily  . atorvastatin  80 mg Oral Daily  . baricitinib  4 mg Oral Daily  . empagliflozin  25 mg Oral Daily  . enoxaparin (LOVENOX) injection  60 mg Subcutaneous QHS  . feeding supplement ([employer] STANDARD 1.4)  325 mL Oral Daily  . furosemide  20 mg Oral BID  . insulin aspart  0-15 Units Subcutaneous TID WC  . insulin aspart  0-5 Units Subcutaneous QHS  . insulin aspart  4 Units Subcutaneous TID WC  . insulin detemir  5 Units Subcutaneous QHS  . methylPREDNISolone (SOLU-MEDROL) injection  60 mg Intravenous Q6H  . metoprolol succinate  100 mg Oral Daily  . multivitamin with minerals  1 tablet Oral Daily  . sodium chloride flush  3 mL Intravenous Q12H   Continuous Infusions: . sodium chloride       LOS: 3 days   Time spent:  Azucena Fallen, DO [facility]  If 7PM-7AM, please contact night-coverage [email or web address]  07/13/2020, 2:00 PM

## 2020-07-13 NOTE — Progress Notes (Signed)
Spoke with MD about placing pt on heated high flow. Md wants to hold off for now. Pt is now maintaining o2 saturation on [place] High Flow and Nasal cannula. RN notified to call if pt begins having saturation issues.

## 2020-07-14 DIAGNOSIS — E119 Type 2 diabetes mellitus without complications: Secondary | ICD-10-CM

## 2020-07-14 DIAGNOSIS — J9601 Acute respiratory failure with hypoxia: Secondary | ICD-10-CM

## 2020-07-14 DIAGNOSIS — I10 Essential (primary) hypertension: Secondary | ICD-10-CM

## 2020-07-14 DIAGNOSIS — U071 COVID-19: Principal | ICD-10-CM

## 2020-07-14 DIAGNOSIS — J1282 Pneumonia due to coronavirus disease 2019: Secondary | ICD-10-CM

## 2020-07-14 LAB — GLUCOSE, CAPILLARY
Glucose-Capillary: 205 mg/dL — ABNORMAL HIGH (ref 70–99)
Glucose-Capillary: 282 mg/dL — ABNORMAL HIGH (ref 70–99)
Glucose-Capillary: 209 mg/dL — ABNORMAL HIGH (ref 70–99)
Glucose-Capillary: 268 mg/dL — ABNORMAL HIGH (ref 70–99)

## 2020-07-14 LAB — CBC WITH DIFFERENTIAL/PLATELET
Abs Immature Granulocytes: 0.16 10*3/uL — ABNORMAL HIGH (ref 0.00–0.07)
Basophils Absolute: 0 10*3/uL (ref 0.0–0.1)
Basophils Relative: 0 %
Eosinophils Absolute: 0 10*3/uL (ref 0.0–0.5)
Eosinophils Relative: 0 %
HCT: 47.5 % (ref 39.0–52.0)
Hemoglobin: 15.5 g/dL (ref 13.0–17.0)
Immature Granulocytes: 2 %
Lymphocytes Relative: 13 %
Lymphs Abs: 1.1 10*3/uL (ref 0.7–4.0)
MCH: 30.2 pg (ref 26.0–34.0)
MCHC: 32.6 g/dL (ref 30.0–36.0)
MCV: 92.4 fL (ref 80.0–100.0)
Monocytes Absolute: 0.5 10*3/uL (ref 0.1–1.0)
Monocytes Relative: 5 %
Neutro Abs: 7 10*3/uL (ref 1.7–7.7)
Neutrophils Relative %: 80 %
Platelets: 345 10*3/uL (ref 150–400)
RBC: 5.14 MIL/uL (ref 4.22–5.81)
RDW: 12.3 % (ref 11.5–15.5)
WBC: 8.8 10*3/uL (ref 4.0–10.5)
nRBC: 0 % (ref 0.0–0.2)

## 2020-07-14 LAB — C-REACTIVE PROTEIN: CRP: 1 mg/dL — ABNORMAL HIGH (ref <1.0)

## 2020-07-14 LAB — COMPREHENSIVE METABOLIC PANEL
ALT: 21 U/L (ref 0–44)
AST: 26 U/L (ref 15–41)
Albumin: 3.2 g/dL — ABNORMAL LOW (ref 3.5–5.0)
Alkaline Phosphatase: 66 U/L (ref 38–126)
Anion gap: 10 (ref 5–15)
BUN: 35 mg/dL — ABNORMAL HIGH (ref 6–20)
CO2: 31 mmol/L (ref 22–32)
Calcium: 8.7 mg/dL — ABNORMAL LOW (ref 8.9–10.3)
Chloride: 102 mmol/L (ref 98–111)
Creatinine, Ser: 1.09 mg/dL (ref 0.61–1.24)
GFR calc Af Amer: >60 mL/min (ref >60)
GFR calc non Af Amer: >60 mL/min (ref >60)
Glucose, Bld: 218 mg/dL — ABNORMAL HIGH (ref 70–99)
Potassium: 4.5 mmol/L (ref 3.5–5.1)
Sodium: 143 mmol/L (ref 135–145)
Total Bilirubin: 0.6 mg/dL (ref 0.3–1.2)
Total Protein: 6.5 g/dL (ref 6.5–8.1)

## 2020-07-14 LAB — D-DIMER, QUANTITATIVE: D-Dimer, Quant: 0.33 ug/mL-FEU (ref 0.00–0.50)

## 2020-07-14 MED ORDER — INSULIN DETEMIR 100 UNIT/ML ~~LOC~~ SOLN
5.0000 [IU] | Freq: Two times a day (BID) | SUBCUTANEOUS | Status: DC
  Administered 2020-07-14 – 2020-07-15 (×3): 5 [IU] via SUBCUTANEOUS
  Filled 2020-07-14 (×4): qty 0.05

## 2020-07-14 MED ORDER — METHYLPREDNISOLONE SODIUM SUCC 125 MG IJ SOLR
40.0000 mg | Freq: Four times a day (QID) | INTRAMUSCULAR | Status: DC
  Administered 2020-07-14 – 2020-07-16 (×7): 40 mg via INTRAVENOUS
  Filled 2020-07-14 (×7): qty 2

## 2020-07-14 MED ORDER — LINAGLIPTIN 5 MG PO TABS
5.0000 mg | ORAL_TABLET | Freq: Every day | ORAL | Status: DC
  Administered 2020-07-14 – 2020-07-22 (×9): 5 mg via ORAL
  Filled 2020-07-14 (×9): qty 1

## 2020-07-14 NOTE — Plan of Care (Signed)
  Problem: Education: Goal: Knowledge of General Education information will improve Description: Including pain rating scale, medication(s)/side effects and non-pharmacologic comfort measures Outcome: Progressing   Problem: Health Behavior/Discharge Planning: Goal: Ability to manage health-related needs will improve Outcome: Progressing   Problem: Clinical Measurements: Goal: Ability to maintain clinical measurements within normal limits will improve Outcome: Progressing Goal: Will remain free from infection Outcome: Progressing Goal: Diagnostic test results will improve Outcome: Progressing Goal: Respiratory complications will improve Outcome: Progressing Goal: Cardiovascular complication will be avoided Outcome: Progressing   Problem: Activity: Goal: Risk for activity intolerance will decrease Outcome: Progressing   Problem: Nutrition: Goal: Adequate nutrition will be maintained Outcome: Progressing   Problem: Coping: Goal: Level of anxiety will decrease Outcome: Progressing   Problem: Elimination: Goal: Will not experience complications related to bowel motility Outcome: Progressing Goal: Will not experience complications related to urinary retention Outcome: Progressing   Problem: Pain Managment: Goal: General experience of comfort will improve Outcome: Progressing   Problem: Safety: Goal: Ability to remain free from injury will improve Outcome: Progressing   Problem: Skin Integrity: Goal: Risk for impaired skin integrity will decrease Outcome: Progressing   Problem: Education: Goal: Knowledge of risk factors and measures for prevention of condition will improve Outcome: Progressing   Problem: Coping: Goal: Psychosocial and spiritual needs will be supported Outcome: Progressing   Problem: Respiratory: Goal: Will maintain a patent airway Outcome: Progressing Goal: Complications related to the disease process, condition or treatment will be avoided or  minimized Outcome: Progressing   Problem: Education: Goal: Ability to describe self-care measures that may prevent or decrease complications (Diabetes Survival Skills Education) will improve Outcome: Progressing Goal: Individualized Educational Video(s) Outcome: Progressing   Problem: Health Behavior/Discharge Planning: Goal: Ability to identify and utilize available resources and services will improve Outcome: Progressing Goal: Ability to manage health-related needs will improve Outcome: Progressing   Problem: Nutritional: Goal: Maintenance of adequate nutrition will improve Outcome: Progressing Goal: Progress toward achieving an optimal weight will improve Outcome: Progressing   

## 2020-07-14 NOTE — Progress Notes (Signed)
Inpatient Diabetes Program Recommendations  AACE/ADA: New Consensus Statement on Inpatient Glycemic Control (2015)  Target Ranges:  Prepandial:   less than 140 mg/dL      Peak postprandial:   less than 180 mg/dL (1-2 hours)      Critically ill patients:  140 - 180 mg/dL   Lab Results  Component Value Date   GLUCAP 282 (H) 07/14/2020   HGBA1C 7.4 (H) 07/07/2020    Review of Glycemic Control Results for [name], Jake Mosley (MRN [id number]) as of 07/14/2020 14:00  Ref. Range 07/13/2020 12:13 07/13/2020 17:08 07/13/2020 20:13 07/14/2020 07:29 07/14/2020 11:45  Glucose-Capillary Latest Ref Range: 70 - 99 mg/dL 329 (H) 518 (H) 841 (H) 205 (H) 282 (H)   Diabetes history:  DM2  Outpatient Diabetes medications:  Jardiance 25 mg daily Metformin 1000 mg BID   Current orders for Inpatient glycemic control: Trajenta 5 mg (added today) Levemir 5 units BID (increased today) Jardiance 25 mg daily  Novolog 0-15 units tid  Novolog 0-5 units qhs  Inpatient Diabetes Program Recommendations:     Novlog 6 units tid with meals if eats at least 50% of meal  Will continue to follow while inpatient.  Thank you, Dulce Sellar, RN, BSN Diabetes Coordinator Inpatient Diabetes Program [phone] (team pager from 8a-5p)

## 2020-07-14 NOTE — Progress Notes (Signed)
PROGRESS NOTE  Jake Mosley  WEX:[id number] DOB: Aug 23, 1961 DOA: 07/10/2020 PCP: Elijio Miles., MD   Brief Narrative: Jake Mosley is a 59 y.o. male with a history of T2DM, HTN, HLD, obesity who had been admitted 8/11-8/12 for covid-19 pneumonia, discharged with outpatient remdesivir infusions scheduled, and was found to be hypoxic at infusion center on 8/14. 2L O2 was given and patient sent to ED, subsequently admitted   Assessment & Plan: Active Problems:   Pneumonia due to COVID-19 virus   Essential hypertension   Acute respiratory failure with hypoxia (HCC)   Type 2 diabetes mellitus without complication (HCC)  Acute hypoxemic respiratory failure due to covid-19 pneumonia: SARS-CoV-2 PCR positive on 8/11.  - Continue supplemental oxygen, still requiring 15L, wean as tolerated.  - Completed remdesivir (8/11 - 8/15) - Continue steroids x10 days, will decrease solumedrol to 40mg  q6h (~1.3mg /kg/day). CRP down 7.7 > 1.0 on 8/18. - s/p lasix x2 days - Continue baricitinib, started 8/16, for 14 doses or until discharge.  - Encourage OOB, IS, FV, and awake proning if able - Continue airborne, contact precautions for 21 days from positive testing. - Monitor CMP and inflammatory markers - Enoxaparin prophylactic dose. D-dimer wnl.    T2DM: HbA1c 7.4%.  - Add levemir, linagliptin, continue novolog 4u TIDWC + moderate SSI - Dietitian consulted. Carb-modified diet.  - Wean steroids as above   Hyperlipidemia:  - Continue statin  HTN:  - Continue metoprolol  Obesity: Estimated body mass index is 41.35 kg/m as calculated from the following:   Height as of this encounter: 5\' 7"  (1.702 m).   Weight as of this encounter: 119.7 kg.  DVT prophylaxis: Lovenox 0.5mg /kg q24h. Code Status: Full Family Communication: None at bedside Disposition Plan:  Status is: Inpatient  Remains inpatient appropriate because:Inpatient level of care appropriate due to severity of  illness   Dispo: The patient is from: Home              Anticipated d/c is to: TBD              Anticipated d/c date is: 2 days              Patient currently is not medically stable to d/c. Will require PT/OT evaluations (ordered) and decreased oxygen demand.   Consultants:   None  Procedures:   None  Antimicrobials:  Remdesivir   Subjective: Reportedly takes oxygen off intermittently and RN reports SpO2 into 60%'s during that episode, improved with reapplication of oxygen. Patient denies any chest pain, leg swelling, orthopnea, doesn't feel much better than at admission.   Objective: Vitals:   07/13/20 2022 07/13/20 2115 07/14/20 0421 07/14/20 1151  BP: 127/66  119/82 103/74  Pulse: 71 (!) 59 (!) 44 (!) 52  Resp:  20 20 19   Temp: 98.1 F (36.7 C)  97.7 F (36.5 C) 98.3 F (36.8 C)  TempSrc: Oral  Axillary Oral  SpO2: (!) 89% (!) 89% 91% 91%  Weight:      Height:        Intake/Output Summary (Last 24 hours) at 07/14/2020 1336 Last data filed at 07/14/2020 0851 Gross per 24 hour  Intake 540 ml  Output 600 ml  Net -60 ml   Filed Weights   07/11/20 1948  Weight: 119.7 kg    Gen: 59 y.o. male in no distress Pulm: Non-labored breathing NRB, crackles mild bilaterally.  CV: Regular rate and rhythm. No murmur, rub, or gallop. No JVD, no pedal edema. GI:  Abdomen soft, non-tender, non-distended, with normoactive bowel sounds. No organomegaly or masses felt. Ext: Warm, no deformities Skin: No rashes, lesions or ulcers Neuro: Alert and oriented. No focal neurological deficits. Psych: Judgement and insight appear normal. Mood & affect appropriate.   Data Reviewed: I have personally reviewed following labs and imaging studies  CBC: Recent Labs  Lab 07/10/20 1505 07/11/20 0500 07/12/20 0436 07/13/20 0452 07/14/20 0425  WBC 7.7 6.3 7.8 5.9 8.8  NEUTROABS 6.7 5.2 6.3 4.8 7.0  HGB 14.9 14.4 14.7 14.8 15.5  HCT 44.9 43.9 45.6 45.4 47.5  MCV 91.1 91.5 92.1 92.3  92.4  PLT 187 198 232 243 345   Basic Metabolic Panel: Recent Labs  Lab 07/08/20 0243 07/08/20 0243 07/10/20 1505 07/11/20 0500 07/12/20 0436 07/13/20 0452 07/14/20 0425  NA 136   < > 140 140 140 139 143  K 4.5   < > 4.2 4.0 4.5 4.5 4.5  CL 102   < > 102 101 102 103 102  CO2 26   < > 27 27 27 23 31   GLUCOSE 183*   < > 225* 220* 149* 162* 218*  BUN 19   < > 24* 22* 23* 26* 35*  CREATININE 1.11   < > 0.88 0.95 0.93 0.94 1.09  CALCIUM 7.4*   < > 8.1* 8.1* 8.2* 8.2* 8.7*  MG 2.0  --   --   --   --   --   --    < > = values in this interval not displayed.   GFR: Estimated Creatinine Clearance: 91.5 mL/min (by C-G formula based on SCr of 1.09 mg/dL). Liver Function Tests: Recent Labs  Lab 07/10/20 1505 07/11/20 0500 07/12/20 0436 07/13/20 0452 07/14/20 0425  AST 59* 45* 27 21 26   ALT 30 27 22 18 21   ALKPHOS 67 64 61 61 66  BILITOT 0.8 1.0 0.8 0.9 0.6  PROT 6.0* 6.0* 6.0* 5.9* 6.5  ALBUMIN 3.0* 3.1* 3.1* 2.9* 3.2*   No results for input(s): LIPASE, AMYLASE in the last 168 hours. No results for input(s): AMMONIA in the last 168 hours. Coagulation Profile: Recent Labs  Lab 07/07/20 1638  INR 1.0   Cardiac Enzymes: No results for input(s): CKTOTAL, CKMB, CKMBINDEX, TROPONINI in the last 168 hours. BNP (last 3 results) No results for input(s): PROBNP in the last 8760 hours. HbA1C: No results for input(s): HGBA1C in the last 72 hours. CBG: Recent Labs  Lab 07/13/20 1213 07/13/20 1708 07/13/20 2013 07/14/20 0729 07/14/20 1145  GLUCAP 290* 323* 273* 205* 282*   Lipid Profile: No results for input(s): CHOL, HDL, LDLCALC, TRIG, CHOLHDL, LDLDIRECT in the last 72 hours. Thyroid Function Tests: No results for input(s): TSH, T4TOTAL, FREET4, T3FREE, THYROIDAB in the last 72 hours. Anemia Panel: No results for input(s): VITAMINB12, FOLATE, FERRITIN, TIBC, IRON, RETICCTPCT in the last 72 hours. Urine analysis:    Component Value Date/Time   COLORURINE YELLOW  07/08/2020 0217   APPEARANCEUR CLEAR 07/08/2020 0217   LABSPEC 1.033 (H) 07/08/2020 0217   PHURINE 5.0 07/08/2020 0217   GLUCOSEU >=500 (A) 07/08/2020 0217   HGBUR NEGATIVE 07/08/2020 0217   BILIRUBINUR NEGATIVE 07/08/2020 0217   KETONESUR NEGATIVE 07/08/2020 0217   PROTEINUR NEGATIVE 07/08/2020 0217   NITRITE NEGATIVE 07/08/2020 0217   LEUKOCYTESUR NEGATIVE 07/08/2020 0217   Recent Results (from the past 240 hour(s))  Blood Culture (routine x 2)     Status: None   Collection Time: 07/07/20  4:38 PM   Specimen:  BLOOD  Result Value Ref Range Status   Specimen Description   Final    BLOOD RIGHT ANTECUBITAL Performed at [facility], [address] [place], [place] [address]    Special Requests   Final    BOTTLES DRAWN AEROBIC AND ANAEROBIC Blood Culture adequate volume Performed at [facility], [address] [place], [place] [address]    Culture   Final    NO GROWTH 5 DAYS Performed at [facility], [address] [place], [place] [address]    Report Status 07/12/2020 FINAL  Final  SARS Coronavirus 2 by RT PCR (hospital order, performed in [facility] hospital lab) Nasopharyngeal Nasopharyngeal Swab     Status: Abnormal   Collection Time: 07/07/20  4:38 PM   Specimen: Nasopharyngeal Swab  Result Value Ref Range Status   SARS Coronavirus 2 POSITIVE (A) NEGATIVE Final    Comment: RESULT CALLED TO, READ BACK BY AND VERIFIED WITH: MARCUM RUTH RN AT 1740 ON 07/07/20 BY [email or web address] (NOTE) SARS-CoV-2 target nucleic acids are DETECTED  SARS-CoV-2 RNA is generally detectable in upper respiratory specimens  during the acute phase of infection.  Positive results are indicative  of the presence of the identified virus, but do not rule out bacterial infection or co-infection with other pathogens not detected by the test.  Clinical correlation with patient history and  other diagnostic information is necessary to determine patient infection status.  The  expected result is negative.  Fact Sheet for Patients:   BoilerBrush.com.cy   Fact Sheet for Healthcare Providers:   https://pope.com/    This test is not yet approved or cleared by the [place] FDA and  has been authorized for detection and/or diagnosis of SARS-CoV-2 by FDA under an Emergency Use Authorization (EUA).  This EUA will remain in effect (meani ng this test can be used) for the duration of  the COVID-19 declaration under Section 564(b)(1) of the Act, 21 U.S.C. section 360-bbb-3(b)(1), unless the authorization is terminated or revoked sooner.  Performed at [facility], [address] [place], [place] [address]   Blood Culture (routine x 2)     Status: None   Collection Time: 07/07/20  4:58 PM   Specimen: BLOOD  Result Value Ref Range Status   Specimen Description   Final    BLOOD BLOOD RIGHT HAND Performed at [facility], [address] [place], [place] [address]    Special Requests   Final    BOTTLES DRAWN AEROBIC AND ANAEROBIC Blood Culture adequate volume Performed at [facility], [address] [place], [place] [address]    Culture   Final    NO GROWTH 5 DAYS Performed at [facility], [address], [place], [place] [address]    Report Status 07/12/2020 FINAL  Final  Respiratory Panel by PCR     Status: None   Collection Time: 07/07/20 10:32 PM   Specimen: Nasopharyngeal Swab; Respiratory  Result Value Ref Range Status   Adenovirus NOT DETECTED NOT DETECTED Final   Coronavirus 229E NOT DETECTED NOT DETECTED Final    Comment: (NOTE) The Coronavirus on the Respiratory Panel, DOES NOT test for the novel  Coronavirus (2019 nCoV)    Coronavirus HKU1 NOT DETECTED NOT DETECTED Final   Coronavirus NL63 NOT DETECTED NOT DETECTED Final   Coronavirus OC43 NOT DETECTED NOT DETECTED Final   Metapneumovirus NOT DETECTED NOT DETECTED Final  Rhinovirus / Enterovirus NOT  DETECTED NOT DETECTED Final   Influenza A NOT DETECTED NOT DETECTED Final   Influenza B NOT DETECTED NOT DETECTED Final   Parainfluenza Virus 1 NOT DETECTED NOT DETECTED Final   Parainfluenza Virus 2 NOT DETECTED NOT DETECTED Final   Parainfluenza Virus 3 NOT DETECTED NOT DETECTED Final   Parainfluenza Virus 4 NOT DETECTED NOT DETECTED Final   Respiratory Syncytial Virus NOT DETECTED NOT DETECTED Final   Bordetella pertussis NOT DETECTED NOT DETECTED Final   Chlamydophila pneumoniae NOT DETECTED NOT DETECTED Final   Mycoplasma pneumoniae NOT DETECTED NOT DETECTED Final    Comment: Performed at [facility], [address] [place], [place] [address]  Culture, blood (Routine X 2) w Reflex to ID Panel     Status: None   Collection Time: 07/07/20 11:13 PM   Specimen: BLOOD  Result Value Ref Range Status   Specimen Description   Final    BLOOD BLOOD LEFT HAND Performed at [facility], [address] [place], [place] [address]    Special Requests   Final    BOTTLES DRAWN AEROBIC ONLY Blood Culture adequate volume Performed at [facility], [address] [place], [place] [address]    Culture   Final    NO GROWTH 5 DAYS Performed at [facility], [address] [place], [place] [address]    Report Status 07/13/2020 FINAL  Final  Culture, blood (Routine X 2) w Reflex to ID Panel     Status: None   Collection Time: 07/07/20 11:13 PM   Specimen: BLOOD  Result Value Ref Range Status   Specimen Description   Final    BLOOD RIGHT ANTECUBITAL Performed at [facility], [address] [place], [place] [address]    Special Requests   Final    BOTTLES DRAWN AEROBIC AND ANAEROBIC Blood Culture adequate volume Performed at [facility], [address] [place], [place] [address]    Culture   Final    NO GROWTH 5 DAYS Performed at [facility], [address] [place], [place] [address]     Report Status 07/13/2020 FINAL  Final  Urine culture     Status: None   Collection Time: 07/08/20  2:17 AM   Specimen: In/Out Cath Urine  Result Value Ref Range Status   Specimen Description   Final    IN/OUT CATH URINE Performed at [facility], [address] [place], [place] [address]    Special Requests   Final    NONE Performed at [facility], [address] [place], [place] [address]    Culture   Final    NO GROWTH Performed at [facility], [address] [place], [place] [address]    Report Status 07/08/2020 FINAL  Final  Blood Culture (routine x 2)     Status: None (Preliminary result)   Collection Time: 07/10/20  3:14 PM   Specimen: BLOOD  Result Value Ref Range Status   Specimen Description   Final    BLOOD LEFT ANTECUBITAL Performed at [facility], [address] [place], [place] [address]    Special Requests   Final    BOTTLES DRAWN AEROBIC AND ANAEROBIC Blood Culture adequate volume Performed at [facility], [address] [place], [place] [address]    Culture   Final    NO GROWTH 4  DAYS Performed at [facility], [address] [place], [place] [address]    Report Status PENDING  Incomplete  Blood Culture (routine x 2)     Status: None (Preliminary result)   Collection Time: 07/10/20  3:15 PM   Specimen: BLOOD LEFT HAND  Result Value Ref Range Status   Specimen Description   Final    BLOOD LEFT HAND Performed at [facility], [address], [place], [place] [address]    Special Requests   Final    BOTTLES DRAWN AEROBIC AND ANAEROBIC Blood Culture results may not be optimal due to an excessive volume of blood received in culture bottles Performed at [facility], [address] [place], [place] [address]    Culture   Final    NO GROWTH 4 DAYS Performed at [facility], [address], [place], [place] [address]    Report Status PENDING   Incomplete      Radiology Studies: No results found.  Scheduled Meds: . (feeding supplement) PROSource Plus  30 mL Oral BID BM  . aspirin EC  81 mg Oral Daily  . atorvastatin  80 mg Oral Daily  . baricitinib  4 mg Oral Daily  . empagliflozin  25 mg Oral Daily  . enoxaparin (LOVENOX) injection  60 mg Subcutaneous QHS  . feeding supplement ([employer] STANDARD 1.4)  325 mL Oral Daily  . furosemide  20 mg Oral BID  . insulin aspart  0-15 Units Subcutaneous TID WC  . insulin aspart  0-5 Units Subcutaneous QHS  . insulin aspart  4 Units Subcutaneous TID WC  . insulin detemir  5 Units Subcutaneous BID  . linagliptin  5 mg Oral Daily  . methylPREDNISolone (SOLU-MEDROL) injection  60 mg Intravenous Q6H  . metoprolol succinate  100 mg Oral Daily  . multivitamin with minerals  1 tablet Oral Daily  . sodium chloride flush  3 mL Intravenous Q12H   Continuous Infusions: . sodium chloride       LOS: 4 days   Time spent: 35 minutes.  Tyrone Nine, MD [facility] [email or web address] 07/14/2020, 1:36 PM

## 2020-07-15 LAB — RENAL FUNCTION PANEL
Albumin: 3.1 g/dL — ABNORMAL LOW (ref 3.5–5.0)
Anion gap: 9 (ref 5–15)
BUN: 39 mg/dL — ABNORMAL HIGH (ref 6–20)
CO2: 26 mmol/L (ref 22–32)
Calcium: 8.2 mg/dL — ABNORMAL LOW (ref 8.9–10.3)
Chloride: 103 mmol/L (ref 98–111)
Creatinine, Ser: 0.97 mg/dL (ref 0.61–1.24)
GFR calc Af Amer: >60 mL/min (ref >60)
GFR calc non Af Amer: >60 mL/min (ref >60)
Glucose, Bld: 177 mg/dL — ABNORMAL HIGH (ref 70–99)
Phosphorus: 5.1 mg/dL — ABNORMAL HIGH (ref 2.5–4.6)
Potassium: 4.2 mmol/L (ref 3.5–5.1)
Sodium: 138 mmol/L (ref 135–145)

## 2020-07-15 LAB — GLUCOSE, CAPILLARY
Glucose-Capillary: 274 mg/dL — ABNORMAL HIGH (ref 70–99)
Glucose-Capillary: 264 mg/dL — ABNORMAL HIGH (ref 70–99)
Glucose-Capillary: 238 mg/dL — ABNORMAL HIGH (ref 70–99)
Glucose-Capillary: 300 mg/dL — ABNORMAL HIGH (ref 70–99)

## 2020-07-15 LAB — CULTURE, BLOOD (ROUTINE X 2)
Culture: NO GROWTH
Culture: NO GROWTH
Special Requests: ADEQUATE

## 2020-07-15 LAB — C-REACTIVE PROTEIN: CRP: 0.6 mg/dL (ref <1.0)

## 2020-07-15 MED ORDER — INSULIN ASPART 100 UNIT/ML ~~LOC~~ SOLN
0.0000 [IU] | Freq: Every day | SUBCUTANEOUS | Status: DC
  Administered 2020-07-15: 3 [IU] via SUBCUTANEOUS
  Administered 2020-07-16: 4 [IU] via SUBCUTANEOUS
  Administered 2020-07-17 – 2020-07-19 (×2): 2 [IU] via SUBCUTANEOUS
  Administered 2020-07-21: 3 [IU] via SUBCUTANEOUS

## 2020-07-15 MED ORDER — INSULIN ASPART 100 UNIT/ML ~~LOC~~ SOLN
6.0000 [IU] | Freq: Three times a day (TID) | SUBCUTANEOUS | Status: DC
  Administered 2020-07-16: 6 [IU] via SUBCUTANEOUS

## 2020-07-15 MED ORDER — INSULIN ASPART 100 UNIT/ML ~~LOC~~ SOLN
0.0000 [IU] | Freq: Three times a day (TID) | SUBCUTANEOUS | Status: DC
  Administered 2020-07-16: 11 [IU] via SUBCUTANEOUS
  Administered 2020-07-16 – 2020-07-17 (×3): 7 [IU] via SUBCUTANEOUS
  Administered 2020-07-17: 4 [IU] via SUBCUTANEOUS
  Administered 2020-07-17: 11 [IU] via SUBCUTANEOUS
  Administered 2020-07-18: 7 [IU] via SUBCUTANEOUS
  Administered 2020-07-18: 4 [IU] via SUBCUTANEOUS
  Administered 2020-07-18 – 2020-07-19 (×2): 7 [IU] via SUBCUTANEOUS
  Administered 2020-07-19: 4 [IU] via SUBCUTANEOUS
  Administered 2020-07-19 – 2020-07-20 (×2): 3 [IU] via SUBCUTANEOUS
  Administered 2020-07-20: 7 [IU] via SUBCUTANEOUS
  Administered 2020-07-21: 4 [IU] via SUBCUTANEOUS
  Administered 2020-07-22: 7 [IU] via SUBCUTANEOUS

## 2020-07-15 MED ORDER — INSULIN DETEMIR 100 UNIT/ML ~~LOC~~ SOLN
10.0000 [IU] | Freq: Two times a day (BID) | SUBCUTANEOUS | Status: DC
  Administered 2020-07-15 – 2020-07-16 (×2): 10 [IU] via SUBCUTANEOUS
  Filled 2020-07-15 (×2): qty 0.1

## 2020-07-15 NOTE — Progress Notes (Addendum)
PROGRESS NOTE  Jake Mosley  PIR:[id number] DOB: February 14, 1961 DOA: 07/10/2020 PCP: Elijio Miles., MD   Brief Narrative: Jake Mosley is a 59 y.o. male with a history of T2DM, HTN, HLD, obesity who had been admitted 8/11-8/12 for covid-19 pneumonia, discharged with outpatient remdesivir infusions scheduled, and was found to be hypoxic at infusion center on 8/14. 2L O2 was given and patient sent to ED, subsequently admitted.  Assessment & Plan: Active Problems:   Pneumonia due to COVID-19 virus   Essential hypertension   Acute respiratory failure with hypoxia (HCC)   Type 2 diabetes mellitus without complication (HCC)  Acute hypoxemic respiratory failure due to covid-19 pneumonia: SARS-CoV-2 PCR positive on 8/11.  - Continue supplemental oxygen, weaned to 9L, will continue to wean as much as possible. - Completed remdesivir (8/11 - 8/15) - Continue steroids x10 days, will decrease solumedrol to 40mg  q6h (~1.3mg /kg/day). CRP normalized on 8/19. - s/p lasix x2 days - Continue baricitinib, started 8/16, for 14 doses or until discharge.  - Encourage OOB, IS, FV, and awake proning if able. Discussed proning at length w/pt and RN. - Continue airborne, contact precautions for 21 days from positive testing. - Monitor CMP and inflammatory markers - Enoxaparin prophylactic dose. D-dimer wnl.    T2DM: HbA1c 7.4%. Uncontrolled.  - Increase levemir, increase mealtime insulin, augment to resistant SSI and continue HS coverage. Continue linagliptin.  - Dietitian consulted. Carb-modified diet.  - Weaning steroids as above   Hyperlipidemia:  - Continue statin  HTN:  - Continue metoprolol  Obesity: Estimated body mass index is 41.35 kg/m as calculated from the following:   Height as of this encounter: 5\' 7"  (1.702 m).   Weight as of this encounter: 119.7 kg.  DVT prophylaxis: Lovenox 0.5mg /kg q24h. Code Status: Full Family Communication: None at bedside Disposition Plan:  Status is:  Inpatient  Remains inpatient appropriate because:Inpatient level of care appropriate due to severity of illness   Dispo: The patient is from: Home              Anticipated d/c is to: TBD              Anticipated d/c date is: 2 days, will remain inpatient as long as hypoxemia is so severe.               Patient currently is not medically stable to d/c. Will require PT/OT evaluations (ordered) and decreased oxygen demand.   Consultants:   None  Procedures:   None  Antimicrobials:  Remdesivir   Subjective: Feels strength improving, shortness of breath improving, able to come down on oxygen needs. No chest pain or leg swelling or bleeding.   Objective: Vitals:   07/15/20 0530 07/15/20 0728 07/15/20 0914 07/15/20 1249  BP: 103/63  105/78 110/72  Pulse: (!) 57  (!) 54 65  Resp: 20  (!) 25   Temp: 98.1 F (36.7 C)  97.8 F (36.6 C) 98.2 F (36.8 C)  TempSrc: Oral  Oral Oral  SpO2: 90% 90% (!) 87% (!) 85%  Weight:      Height:       No intake or output data in the 24 hours ending 07/15/20 1738 Filed Weights   07/11/20 1948  Weight: 119.7 kg   Gen: 59 y.o. male in no distress Pulm: Nonlabored breathing 9LPM. Crackles mildly bilaterally. CV: Regular rate and rhythm. No murmur, rub, or gallop. N JVD, no dependent edema. GI: Abdomen soft, non-tender, non-distended, with normoactive bowel sounds.  Ext:  Warm, no deformities Skin: No rashes, lesions or ulcers on visualized skin. Neuro: Alert and oriented. No focal neurological deficits. Psych: Judgement and insight appear fair. Mood euthymic & affect congruent. Behavior is appropriate.    Data Reviewed: I have personally reviewed following labs and imaging studies  CBC: Recent Labs  Lab 07/10/20 1505 07/11/20 0500 07/12/20 0436 07/13/20 0452 07/14/20 0425  WBC 7.7 6.3 7.8 5.9 8.8  NEUTROABS 6.7 5.2 6.3 4.8 7.0  HGB 14.9 14.4 14.7 14.8 15.5  HCT 44.9 43.9 45.6 45.4 47.5  MCV 91.1 91.5 92.1 92.3 92.4  PLT 187 198  232 243 345   Basic Metabolic Panel: Recent Labs  Lab 07/11/20 0500 07/12/20 0436 07/13/20 0452 07/14/20 0425 07/15/20 0432  NA 140 140 139 143 138  K 4.0 4.5 4.5 4.5 4.2  CL 101 102 103 102 103  CO2 GLUCOSE 220* 149* 162* 218* 177*  BUN 22* 23* 26* 35* 39*  CREATININE 0.95 0.93 0.94 1.09 0.97  CALCIUM 8.1* 8.2* 8.2* 8.7* 8.2*  PHOS  --   --   --   --  5.1*   GFR: Estimated Creatinine Clearance: 102.9 mL/min (by C-G formula based on SCr of 0.97 mg/dL). Liver Function Tests: Recent Labs  Lab 07/10/20 1505 07/10/20 1505 07/11/20 0500 07/12/20 0436 07/13/20 0452 07/14/20 0425 07/15/20 0432  AST 59*  --  45* --   ALT 30  --  --   ALKPHOS 67  --  64 61 61 66  --   BILITOT 0.8  --  1.0 0.8 0.9 0.6  --   PROT 6.0*  --  6.0* 6.0* 5.9* 6.5  --   ALBUMIN 3.0*   < > 3.1* 3.1* 2.9* 3.2* 3.1*   < > = values in this interval not displayed.   No results for input(s): LIPASE, AMYLASE in the last 168 hours. No results for input(s): AMMONIA in the last 168 hours. Coagulation Profile: No results for input(s): INR, PROTIME in the last 168 hours. Cardiac Enzymes: No results for input(s): CKTOTAL, CKMB, CKMBINDEX, TROPONINI in the last 168 hours. BNP (last 3 results) No results for input(s): PROBNP in the last 8760 hours. HbA1C: No results for input(s): HGBA1C in the last 72 hours. CBG: Recent Labs  Lab 07/14/20 1145 07/14/20 1637 07/14/20 2025 07/15/20 0725 07/15/20 1244  GLUCAP 282* 209* 268* 274* 264*   Lipid Profile: No results for input(s): CHOL, HDL, LDLCALC, TRIG, CHOLHDL, LDLDIRECT in the last 72 hours. Thyroid Function Tests: No results for input(s): TSH, T4TOTAL, FREET4, T3FREE, THYROIDAB in the last 72 hours. Anemia Panel: No results for input(s): VITAMINB12, FOLATE, FERRITIN, TIBC, IRON, RETICCTPCT in the last 72 hours. Urine analysis:    Component Value Date/Time   COLORURINE YELLOW 07/08/2020 0217   APPEARANCEUR  CLEAR 07/08/2020 0217   LABSPEC 1.033 (H) 07/08/2020 0217   PHURINE 5.0 07/08/2020 0217   GLUCOSEU >=500 (A) 07/08/2020 0217   HGBUR NEGATIVE 07/08/2020 0217   BILIRUBINUR NEGATIVE 07/08/2020 0217   KETONESUR NEGATIVE 07/08/2020 0217   PROTEINUR NEGATIVE 07/08/2020 0217   NITRITE NEGATIVE 07/08/2020 0217   LEUKOCYTESUR NEGATIVE 07/08/2020 0217   Recent Results (from the past 240 hour(s))  Blood Culture (routine x 2)     Status: None   Collection Time: 07/07/20  4:38 PM   Specimen: BLOOD  Result Value Ref Range Status   Specimen Description   Final  BLOOD RIGHT ANTECUBITAL Performed at [facility], [address] [place], [place] [address]    Special Requests   Final    BOTTLES DRAWN AEROBIC AND ANAEROBIC Blood Culture adequate volume Performed at [facility], [address] [place], [place] [address]    Culture   Final    NO GROWTH 5 DAYS Performed at [facility], [address] [place], [place] [address]    Report Status 07/12/2020 FINAL  Final  SARS Coronavirus 2 by RT PCR (hospital order, performed in [facility] hospital lab) Nasopharyngeal Nasopharyngeal Swab     Status: Abnormal   Collection Time: 07/07/20  4:38 PM   Specimen: Nasopharyngeal Swab  Result Value Ref Range Status   SARS Coronavirus 2 POSITIVE (A) NEGATIVE Final    Comment: RESULT CALLED TO, READ BACK BY AND VERIFIED WITH: MARCUM RUTH RN AT 1740 ON 07/07/20 BY [email or web address] (NOTE) SARS-CoV-2 target nucleic acids are DETECTED  SARS-CoV-2 RNA is generally detectable in upper respiratory specimens  during the acute phase of infection.  Positive results are indicative  of the presence of the identified virus, but do not rule out bacterial infection or co-infection with other pathogens not detected by the test.  Clinical correlation with patient history and  other diagnostic information is necessary to determine patient infection status.  The expected result is negative.  Fact  Sheet for Patients:   BoilerBrush.com.cy   Fact Sheet for Healthcare Providers:   https://pope.com/    This test is not yet approved or cleared by the [place] FDA and  has been authorized for detection and/or diagnosis of SARS-CoV-2 by FDA under an Emergency Use Authorization (EUA).  This EUA will remain in effect (meani ng this test can be used) for the duration of  the COVID-19 declaration under Section 564(b)(1) of the Act, 21 U.S.C. section 360-bbb-3(b)(1), unless the authorization is terminated or revoked sooner.  Performed at [facility], [address] [place], [place] [address]   Blood Culture (routine x 2)     Status: None   Collection Time: 07/07/20  4:58 PM   Specimen: BLOOD  Result Value Ref Range Status   Specimen Description   Final    BLOOD BLOOD RIGHT HAND Performed at [facility], [address] [place], [place] [address]    Special Requests   Final    BOTTLES DRAWN AEROBIC AND ANAEROBIC Blood Culture adequate volume Performed at [facility], [address] [place], [place] [address]    Culture   Final    NO GROWTH 5 DAYS Performed at [facility], [address] [place], [place] [address]    Report Status 07/12/2020 FINAL  Final  Respiratory Panel by PCR     Status: None   Collection Time: 07/07/20 10:32 PM   Specimen: Nasopharyngeal Swab; Respiratory  Result Value Ref Range Status   Adenovirus NOT DETECTED NOT DETECTED Final   Coronavirus 229E NOT DETECTED NOT DETECTED Final    Comment: (NOTE) The Coronavirus on the Respiratory Panel, DOES NOT test for the novel  Coronavirus (2019 nCoV)    Coronavirus HKU1 NOT DETECTED NOT DETECTED Final   Coronavirus NL63 NOT DETECTED NOT DETECTED Final   Coronavirus OC43 NOT DETECTED NOT DETECTED Final   Metapneumovirus NOT DETECTED NOT DETECTED Final   Rhinovirus / Enterovirus NOT DETECTED NOT DETECTED Final    Influenza A NOT DETECTED NOT DETECTED  Final   Influenza B NOT DETECTED NOT DETECTED Final   Parainfluenza Virus 1 NOT DETECTED NOT DETECTED Final   Parainfluenza Virus 2 NOT DETECTED NOT DETECTED Final   Parainfluenza Virus 3 NOT DETECTED NOT DETECTED Final   Parainfluenza Virus 4 NOT DETECTED NOT DETECTED Final   Respiratory Syncytial Virus NOT DETECTED NOT DETECTED Final   Bordetella pertussis NOT DETECTED NOT DETECTED Final   Chlamydophila pneumoniae NOT DETECTED NOT DETECTED Final   Mycoplasma pneumoniae NOT DETECTED NOT DETECTED Final    Comment: Performed at [facility], [address] [place], [place] [address]  Culture, blood (Routine X 2) w Reflex to ID Panel     Status: None   Collection Time: 07/07/20 11:13 PM   Specimen: BLOOD  Result Value Ref Range Status   Specimen Description   Final    BLOOD BLOOD LEFT HAND Performed at [facility], [address] [place], [place] [address]    Special Requests   Final    BOTTLES DRAWN AEROBIC ONLY Blood Culture adequate volume Performed at [facility], [address] [place], [place] [address]    Culture   Final    NO GROWTH 5 DAYS Performed at [facility], [address] [place], [place] [address]    Report Status 07/13/2020 FINAL  Final  Culture, blood (Routine X 2) w Reflex to ID Panel     Status: None   Collection Time: 07/07/20 11:13 PM   Specimen: BLOOD  Result Value Ref Range Status   Specimen Description   Final    BLOOD RIGHT ANTECUBITAL Performed at [facility], [address] [place], [place] [address]    Special Requests   Final    BOTTLES DRAWN AEROBIC AND ANAEROBIC Blood Culture adequate volume Performed at [facility], [address] [place], [place] [address]    Culture   Final    NO GROWTH 5 DAYS Performed at [facility], [address] [place], [place] [address]    Report Status 07/13/2020 FINAL   Final  Urine culture     Status: None   Collection Time: 07/08/20  2:17 AM   Specimen: In/Out Cath Urine  Result Value Ref Range Status   Specimen Description   Final    IN/OUT CATH URINE Performed at [facility], [address] [place], [place] [address]    Special Requests   Final    NONE Performed at [facility], [address] [place], [place] [address]    Culture   Final    NO GROWTH Performed at [facility], [address] [place], [place] [address]    Report Status 07/08/2020 FINAL  Final  Blood Culture (routine x 2)     Status: None   Collection Time: 07/10/20  3:14 PM   Specimen: BLOOD  Result Value Ref Range Status   Specimen Description   Final    BLOOD LEFT ANTECUBITAL Performed at [facility], [address] [place], [place] [address]    Special Requests   Final    BOTTLES DRAWN AEROBIC AND ANAEROBIC Blood Culture adequate volume Performed at [facility], [address] [place], [place] [address]    Culture   Final    NO GROWTH 5 DAYS Performed at [facility], [address] [place], [place] [address]    Report Status  07/15/2020 FINAL  Final  Blood Culture (routine x 2)     Status: None   Collection Time: 07/10/20  3:15 PM   Specimen: BLOOD LEFT HAND  Result Value Ref Range Status   Specimen Description   Final    BLOOD LEFT HAND Performed at [facility], [address] [place], [place] [address]    Special Requests   Final    BOTTLES DRAWN AEROBIC AND ANAEROBIC Blood Culture results may not be optimal due to an excessive volume of blood received in culture bottles Performed at [facility], [address], [place], [place] [address]    Culture   Final    NO GROWTH 5 DAYS Performed at [facility], [address], [place], [place] [address]    Report Status 07/15/2020 FINAL  Final      Radiology Studies: No results found.  Scheduled  Meds: . (feeding supplement) PROSource Plus  30 mL Oral BID BM  . aspirin EC  81 mg Oral Daily  . atorvastatin  80 mg Oral Daily  . baricitinib  4 mg Oral Daily  . empagliflozin  25 mg Oral Daily  . enoxaparin (LOVENOX) injection  60 mg Subcutaneous QHS  . feeding supplement (K[employer]ARMS STANDARD 1.4)  325 mL Oral Daily  . insulin aspart  0-15 Units Subcutaneous TID WC  . insulin aspart  0-5 Units Subcutaneous QHS  . insulin aspart  4 Units Subcutaneous TID WC  . insulin detemir  5 Units Subcutaneous BID  . linagliptin  5 mg Oral Daily  . methylPREDNISolone (SOLU-MEDROL) injection  40 mg Intravenous Q6H  . metoprolol succinate  100 mg Oral Daily  . multivitamin with minerals  1 tablet Oral Daily  . sodium chloride flush  3 mL Intravenous Q12H   Continuous Infusions: . sodium chloride       LOS: 5 days   Time spent: 35 minutes.  Tyrone Nine, MD [facility] [email or web address] 07/15/2020, 5:38 PM

## 2020-07-15 NOTE — Plan of Care (Signed)
  Problem: Education: Goal: Knowledge of General Education information will improve Description: Including pain rating scale, medication(s)/side effects and non-pharmacologic comfort measures Outcome: Progressing   Problem: Health Behavior/Discharge Planning: Goal: Ability to manage health-related needs will improve Outcome: Progressing   Problem: Clinical Measurements: Goal: Ability to maintain clinical measurements within normal limits will improve Outcome: Progressing Goal: Will remain free from infection Outcome: Progressing Goal: Diagnostic test results will improve Outcome: Progressing Goal: Respiratory complications will improve Outcome: Progressing Goal: Cardiovascular complication will be avoided Outcome: Progressing   Problem: Activity: Goal: Risk for activity intolerance will decrease Outcome: Progressing   Problem: Nutrition: Goal: Adequate nutrition will be maintained Outcome: Progressing   Problem: Coping: Goal: Level of anxiety will decrease Outcome: Progressing   Problem: Elimination: Goal: Will not experience complications related to bowel motility Outcome: Progressing Goal: Will not experience complications related to urinary retention Outcome: Progressing   Problem: Pain Managment: Goal: General experience of comfort will improve Outcome: Progressing   Problem: Safety: Goal: Ability to remain free from injury will improve Outcome: Progressing   Problem: Skin Integrity: Goal: Risk for impaired skin integrity will decrease Outcome: Progressing   Problem: Education: Goal: Knowledge of risk factors and measures for prevention of condition will improve Outcome: Progressing   Problem: Coping: Goal: Psychosocial and spiritual needs will be supported Outcome: Progressing   Problem: Respiratory: Goal: Will maintain a patent airway Outcome: Progressing Goal: Complications related to the disease process, condition or treatment will be avoided or  minimized Outcome: Progressing   Problem: Education: Goal: Ability to describe self-care measures that may prevent or decrease complications (Diabetes Survival Skills Education) will improve Outcome: Progressing Goal: Individualized Educational Video(s) Outcome: Progressing   Problem: Health Behavior/Discharge Planning: Goal: Ability to identify and utilize available resources and services will improve Outcome: Progressing Goal: Ability to manage health-related needs will improve Outcome: Progressing   Problem: Nutritional: Goal: Maintenance of adequate nutrition will improve Outcome: Progressing Goal: Progress toward achieving an optimal weight will improve Outcome: Progressing

## 2020-07-16 LAB — GLUCOSE, CAPILLARY
Glucose-Capillary: 217 mg/dL — ABNORMAL HIGH (ref 70–99)
Glucose-Capillary: 239 mg/dL — ABNORMAL HIGH (ref 70–99)
Glucose-Capillary: 264 mg/dL — ABNORMAL HIGH (ref 70–99)
Glucose-Capillary: 310 mg/dL — ABNORMAL HIGH (ref 70–99)

## 2020-07-16 MED ORDER — INSULIN ASPART 100 UNIT/ML ~~LOC~~ SOLN
8.0000 [IU] | Freq: Three times a day (TID) | SUBCUTANEOUS | Status: DC
  Administered 2020-07-16 – 2020-07-17 (×5): 8 [IU] via SUBCUTANEOUS

## 2020-07-16 MED ORDER — INSULIN DETEMIR 100 UNIT/ML ~~LOC~~ SOLN
5.0000 [IU] | Freq: Once | SUBCUTANEOUS | Status: AC
  Administered 2020-07-16: 5 [IU] via SUBCUTANEOUS
  Filled 2020-07-16: qty 0.05

## 2020-07-16 MED ORDER — METHYLPREDNISOLONE SODIUM SUCC 125 MG IJ SOLR
40.0000 mg | Freq: Three times a day (TID) | INTRAMUSCULAR | Status: DC
  Administered 2020-07-16 – 2020-07-17 (×3): 40 mg via INTRAVENOUS
  Filled 2020-07-16 (×3): qty 2

## 2020-07-16 MED ORDER — INSULIN DETEMIR 100 UNIT/ML ~~LOC~~ SOLN
15.0000 [IU] | Freq: Two times a day (BID) | SUBCUTANEOUS | Status: DC
  Administered 2020-07-16 – 2020-07-17 (×3): 15 [IU] via SUBCUTANEOUS
  Filled 2020-07-16 (×4): qty 0.15

## 2020-07-16 NOTE — Plan of Care (Signed)
  Problem: Education: Goal: Knowledge of General Education information will improve Description: Including pain rating scale, medication(s)/side effects and non-pharmacologic comfort measures Outcome: Progressing   Problem: Health Behavior/Discharge Planning: Goal: Ability to manage health-related needs will improve Outcome: Progressing   Problem: Clinical Measurements: Goal: Ability to maintain clinical measurements within normal limits will improve Outcome: Progressing Goal: Will remain free from infection Outcome: Progressing Goal: Diagnostic test results will improve Outcome: Progressing Goal: Respiratory complications will improve Outcome: Progressing Goal: Cardiovascular complication will be avoided Outcome: Progressing   Problem: Activity: Goal: Risk for activity intolerance will decrease Outcome: Progressing   Problem: Nutrition: Goal: Adequate nutrition will be maintained Outcome: Progressing   Problem: Coping: Goal: Level of anxiety will decrease Outcome: Progressing   Problem: Elimination: Goal: Will not experience complications related to bowel motility Outcome: Progressing Goal: Will not experience complications related to urinary retention Outcome: Progressing   Problem: Pain Managment: Goal: General experience of comfort will improve Outcome: Progressing   Problem: Safety: Goal: Ability to remain free from injury will improve Outcome: Progressing   Problem: Skin Integrity: Goal: Risk for impaired skin integrity will decrease Outcome: Progressing   Problem: Education: Goal: Knowledge of risk factors and measures for prevention of condition will improve Outcome: Progressing   Problem: Coping: Goal: Psychosocial and spiritual needs will be supported Outcome: Progressing   Problem: Respiratory: Goal: Will maintain a patent airway Outcome: Progressing Goal: Complications related to the disease process, condition or treatment will be avoided or  minimized Outcome: Progressing   Problem: Education: Goal: Ability to describe self-care measures that may prevent or decrease complications (Diabetes Survival Skills Education) will improve Outcome: Progressing Goal: Individualized Educational Video(s) Outcome: Progressing   Problem: Health Behavior/Discharge Planning: Goal: Ability to identify and utilize available resources and services will improve Outcome: Progressing Goal: Ability to manage health-related needs will improve Outcome: Progressing   Problem: Nutritional: Goal: Maintenance of adequate nutrition will improve Outcome: Progressing Goal: Progress toward achieving an optimal weight will improve Outcome: Progressing   

## 2020-07-16 NOTE — Progress Notes (Signed)
NUTRITION EDUCATION NOTE   RD consulted for nutrition education regarding diabetes.   Lab Results  Component Value Date   HGBA1C 7.4 (H) 07/07/2020    RD placed "Carbohydrate Counting for People with Diabetes" handout from the Academy of Nutrition and Dietetics in Discharge Instructions.   MD note from today states patient continues to experience severe hypoxia and that patient is being urged to prone and be out of bed as much as possible.   Patient is not appropriate for impactful diet education given current acute illness. Recommend consult for outpatient RD once patient is no longer COVID positive and education would be better retained.   Body mass index is 41.35 kg/m. Pt meets criteria for morbid obesity based on current BMI.  Current diet order is Carb Modified , patient is consuming approximately 100% of meals at this time. Labs and medications reviewed.   No further nutrition interventions warranted at this time. RD contact information provided. If additional nutrition issues arise, please re-consult RD.     Trenton Gammon, MS, RD, LDN, CNSC Inpatient Clinical Dietitian RD pager # available in AMION  After hours/weekend pager # available in [facility]

## 2020-07-16 NOTE — Progress Notes (Signed)
PROGRESS NOTE  Jake Mosley  GUR:[id number] DOB: Nov 17, 1961 DOA: 07/10/2020 PCP: Elijio Miles., MD   Brief Narrative: Jake Mosley is a 59 y.o. male with a history of T2DM, HTN, HLD, obesity who had been admitted 8/11-8/12 for covid-19 pneumonia, discharged with outpatient remdesivir infusions scheduled, and was found to be hypoxic at infusion center on 8/14. 2L O2 was given and patient sent to ED, subsequently admitted.  Assessment & Plan: Active Problems:   Pneumonia due to COVID-19 virus   Essential hypertension   Acute respiratory failure with hypoxia (HCC)   Type 2 diabetes mellitus without complication (HCC)  Acute hypoxemic respiratory failure due to covid-19 pneumonia: SARS-CoV-2 PCR positive on 8/11. - Continue supplemental oxygen. Continues to require 8-9L O2 at rest w/SpO2 in mid-80%'s w/reliable pleth. Pt is typical happy hypoxemic. - Completed remdesivir (8/11 - 8/15) - Continue steroids, solumedrol to 40mg  q8h (lowering to minimize hyperglycemia and in light of CRP normalized on 8/19). - s/p lasix x2 days. No peripheral overload - Continue baricitinib, started 8/16, for 14 doses or until discharge.  - Encourage OOB, IS, FV, and awake proning if able. Again urged to prone as much as possible. - Continue airborne, contact precautions for 21 days from positive testing. - Monitor CMP and inflammatory markers - Enoxaparin prophylactic dose. D-dimer wnl.    T2DM: HbA1c 7.4%. Uncontrolled.  - Increase levemir to 15u BID, increase mealtime novolog to 8u TIDWC, continue resistant SSI and continue HS coverage. Continue linagliptin.  - Dietitian consulted. Carb-modified diet.  - Weaning steroids as above   Hyperlipidemia:  - Continue statin  HTN:  - Continue metoprolol  Obesity: Estimated body mass index is 41.35 kg/m as calculated from the following:   Height as of this encounter: 5\' 7"  (1.702 m).   Weight as of this encounter: 119.7 kg.  DVT prophylaxis: Lovenox  0.5mg /kg q24h. Code Status: Full Family Communication: None at bedside. Does not request a call to family. Disposition Plan:  Status is: Inpatient  Remains inpatient appropriate because:Inpatient level of care appropriate due to severity of illness   Dispo: The patient is from: Home              Anticipated d/c is to: TBD              Anticipated d/c date is: 2 days, will remain inpatient as long as hypoxemia is so severe              Patient currently is not medically stable to d/c. Will require PT/OT evaluations (ordered) and decreased oxygen demand.   Consultants:   None  Procedures:   None  Antimicrobials:  Remdesivir 8/11 - 8/15  Subjective: No new issues, wants to walk around room more. Feels only moderately short of breath with exertion, improved from prior, improves with rest. Asks if he can come off oxygen while walking but understands risk of passing out and severe hypoxemia do not allow this. Denies chest pain, leg swelling.   Objective: Vitals:   07/15/20 0914 07/15/20 1249 07/15/20 2122 07/16/20 0553  BP: 105/78 110/72 132/80 119/77  Pulse: (!) 54 65 64 (!) 51  Resp: (!) 25  20 20   Temp: 97.8 F (36.6 C) 98.2 F (36.8 C) 98.3 F (36.8 C) 98 F (36.7 C)  TempSrc: Oral Oral Oral Oral  SpO2: (!) 87% (!) 85% (!) 89% 91%  Weight:      Height:        Intake/Output Summary (Last 24 hours) at  07/16/2020 1133 Last data filed at 07/16/2020 0948 Gross per 24 hour  Intake 420 ml  Output --  Net 420 ml   Filed Weights   07/11/20 1948  Weight: 119.7 kg   Gen: 59 y.o. male in no distress Pulm: Nonlabored breathing 9L O2 but with SpO2 remaining in mid 80%'s despite speaking full sentences, pulling >2L on incentive spirometry but with very diminished lung sounds bilaterally despite that. Crackles are not significant. No wheezing noted. CV: Regular rate and rhythm. No murmur, rub, or gallop. No JVD, no dependent edema. GI: Abdomen soft, non-tender, non-distended,  with normoactive bowel sounds.  Ext: Warm, no deformities Skin: No rashes, lesions or ulcers on visualized skin. Neuro: Alert and oriented. No focal neurological deficits. Psych: Judgement and insight appear fair. Mood euthymic & affect congruent. Behavior is appropriate.    Data Reviewed: I have personally reviewed following labs and imaging studies  CBC: Recent Labs  Lab 07/10/20 1505 07/11/20 0500 07/12/20 0436 07/13/20 0452 07/14/20 0425  WBC 7.7 6.3 7.8 5.9 8.8  NEUTROABS 6.7 5.2 6.3 4.8 7.0  HGB 14.9 14.4 14.7 14.8 15.5  HCT 44.9 43.9 45.6 45.4 47.5  MCV 91.1 91.5 92.1 92.3 92.4  PLT 187 198 232 243 345   Basic Metabolic Panel: Recent Labs  Lab 07/11/20 0500 07/12/20 0436 07/13/20 0452 07/14/20 0425 07/15/20 0432  NA 140 140 139 143 138  K 4.0 4.5 4.5 4.5 4.2  CL 101 102 103 102 103  CO2 GLUCOSE 220* 149* 162* 218* 177*  BUN 22* 23* 26* 35* 39*  CREATININE 0.95 0.93 0.94 1.09 0.97  CALCIUM 8.1* 8.2* 8.2* 8.7* 8.2*  PHOS  --   --   --   --  5.1*   GFR: Estimated Creatinine Clearance: 102.9 mL/min (by C-G formula based on SCr of 0.97 mg/dL). Liver Function Tests: Recent Labs  Lab 07/10/20 1505 07/10/20 1505 07/11/20 0500 07/12/20 0436 07/13/20 0452 07/14/20 0425 07/15/20 0432  AST 59*  --  45* --   ALT 30  --  --   ALKPHOS 67  --  64 61 61 66  --   BILITOT 0.8  --  1.0 0.8 0.9 0.6  --   PROT 6.0*  --  6.0* 6.0* 5.9* 6.5  --   ALBUMIN 3.0*   < > 3.1* 3.1* 2.9* 3.2* 3.1*   < > = values in this interval not displayed.   CBG: Recent Labs  Lab 07/15/20 0725 07/15/20 1244 07/15/20 1737 07/15/20 2119 07/16/20 0755  GLUCAP 274* 264* 238* 300* 217*   Urine analysis:    Component Value Date/Time   COLORURINE YELLOW 07/08/2020 0217   APPEARANCEUR CLEAR 07/08/2020 0217   LABSPEC 1.033 (H) 07/08/2020 0217   PHURINE 5.0 07/08/2020 0217   GLUCOSEU >=500 (A) 07/08/2020 0217   HGBUR NEGATIVE 07/08/2020 0217    BILIRUBINUR NEGATIVE 07/08/2020 0217   KETONESUR NEGATIVE 07/08/2020 0217   PROTEINUR NEGATIVE 07/08/2020 0217   NITRITE NEGATIVE 07/08/2020 0217   LEUKOCYTESUR NEGATIVE 07/08/2020 0217   Recent Results (from the past 240 hour(s))  Blood Culture (routine x 2)     Status: None   Collection Time: 07/07/20  4:38 PM   Specimen: BLOOD  Result Value Ref Range Status   Specimen Description   Final    BLOOD RIGHT ANTECUBITAL Performed at [facility], [address], [place], [place] [address]  Special Requests   Final    BOTTLES DRAWN AEROBIC AND ANAEROBIC Blood Culture adequate volume Performed at [facility], [address] [place], [place] [address]    Culture   Final    NO GROWTH 5 DAYS Performed at [facility], [address], [place], [place] [address]    Report Status 07/12/2020 FINAL  Final  SARS Coronavirus 2 by RT PCR (hospital order, performed in [facility] hospital lab) Nasopharyngeal Nasopharyngeal Swab     Status: Abnormal   Collection Time: 07/07/20  4:38 PM   Specimen: Nasopharyngeal Swab  Result Value Ref Range Status   SARS Coronavirus 2 POSITIVE (A) NEGATIVE Final    Comment: RESULT CALLED TO, READ BACK BY AND VERIFIED WITH: MARCUM RUTH RN AT 1740 ON 07/07/20 BY [email or web address] (NOTE) SARS-CoV-2 target nucleic acids are DETECTED  SARS-CoV-2 RNA is generally detectable in upper respiratory specimens  during the acute phase of infection.  Positive results are indicative  of the presence of the identified virus, but do not rule out bacterial infection or co-infection with other pathogens not detected by the test.  Clinical correlation with patient history and  other diagnostic information is necessary to determine patient infection status.  The expected result is negative.  Fact Sheet for Patients:   BoilerBrush.com.cy   Fact Sheet for Healthcare Providers:   https://pope.com/    This test  is not yet approved or cleared by the [place] FDA and  has been authorized for detection and/or diagnosis of SARS-CoV-2 by FDA under an Emergency Use Authorization (EUA).  This EUA will remain in effect (meani ng this test can be used) for the duration of  the COVID-19 declaration under Section 564(b)(1) of the Act, 21 U.S.C. section 360-bbb-3(b)(1), unless the authorization is terminated or revoked sooner.  Performed at [facility], [address] [place], [place] [address]   Blood Culture (routine x 2)     Status: None   Collection Time: 07/07/20  4:58 PM   Specimen: BLOOD  Result Value Ref Range Status   Specimen Description   Final    BLOOD BLOOD RIGHT HAND Performed at [facility], [address] [place], [place] [address]    Special Requests   Final    BOTTLES DRAWN AEROBIC AND ANAEROBIC Blood Culture adequate volume Performed at [facility], [address] [place], [place] [address]    Culture   Final    NO GROWTH 5 DAYS Performed at [facility], [address] [place], [place] [address]    Report Status 07/12/2020 FINAL  Final  Respiratory Panel by PCR     Status: None   Collection Time: 07/07/20 10:32 PM   Specimen: Nasopharyngeal Swab; Respiratory  Result Value Ref Range Status   Adenovirus NOT DETECTED NOT DETECTED Final   Coronavirus 229E NOT DETECTED NOT DETECTED Final    Comment: (NOTE) The Coronavirus on the Respiratory Panel, DOES NOT test for the novel  Coronavirus (2019 nCoV)    Coronavirus HKU1 NOT DETECTED NOT DETECTED Final   Coronavirus NL63 NOT DETECTED NOT DETECTED Final   Coronavirus OC43 NOT DETECTED NOT DETECTED Final   Metapneumovirus NOT DETECTED NOT DETECTED Final   Rhinovirus / Enterovirus NOT DETECTED NOT DETECTED Final   Influenza A NOT DETECTED NOT DETECTED Final   Influenza B NOT DETECTED NOT DETECTED Final   Parainfluenza Virus 1 NOT DETECTED NOT DETECTED Final  Parainfluenza Virus  2 NOT DETECTED NOT DETECTED Final   Parainfluenza Virus 3 NOT DETECTED NOT DETECTED Final   Parainfluenza Virus 4 NOT DETECTED NOT DETECTED Final   Respiratory Syncytial Virus NOT DETECTED NOT DETECTED Final   Bordetella pertussis NOT DETECTED NOT DETECTED Final   Chlamydophila pneumoniae NOT DETECTED NOT DETECTED Final   Mycoplasma pneumoniae NOT DETECTED NOT DETECTED Final    Comment: Performed at [facility], [address] [place], [place] [address]  Culture, blood (Routine X 2) w Reflex to ID Panel     Status: None   Collection Time: 07/07/20 11:13 PM   Specimen: BLOOD  Result Value Ref Range Status   Specimen Description   Final    BLOOD BLOOD LEFT HAND Performed at [facility], [address] [place], [place] [address]    Special Requests   Final    BOTTLES DRAWN AEROBIC ONLY Blood Culture adequate volume Performed at [facility] [place] [facility], [address] [place], [place] [address]    Culture   Final    NO GROWTH 5 DAYS Performed at [facility], [address] [place], [place] [address]    Report Status 07/13/2020 FINAL  Final  Culture, blood (Routine X 2) w Reflex to ID Panel     Status: None   Collection Time: 07/07/20 11:13 PM   Specimen: BLOOD  Result Value Ref Range Status   Specimen Description   Final    BLOOD RIGHT ANTECUBITAL Performed at [facility], [address] [place], [place] [address]    Special Requests   Final    BOTTLES DRAWN AEROBIC AND ANAEROBIC Blood Culture adequate volume Performed at [facility], [address] [place], [place] [address]    Culture   Final    NO GROWTH 5 DAYS Performed at [facility], [address] [place], [place] [address]    Report Status 07/13/2020 FINAL  Final  Urine culture     Status: None   Collection Time: 07/08/20  2:17 AM   Specimen: In/Out Cath Urine  Result Value Ref Range Status   Specimen Description   Final      IN/OUT CATH URINE Performed at [facility], [address] [place], [place] [address]    Special Requests   Final    NONE Performed at [facility], [address] [place], [place] [address]    Culture   Final    NO GROWTH Performed at [facility], [address] [place], [place] [address]    Report Status 07/08/2020 FINAL  Final  Blood Culture (routine x 2)     Status: None   Collection Time: 07/10/20  3:14 PM   Specimen: BLOOD  Result Value Ref Range Status   Specimen Description   Final    BLOOD LEFT ANTECUBITAL Performed at [facility], [address] [place], [place] [address]    Special Requests   Final    BOTTLES DRAWN AEROBIC AND ANAEROBIC Blood Culture adequate volume Performed at [facility], [address] [place], [place] [address]    Culture   Final    NO GROWTH 5 DAYS Performed at [facility], [address] [place], [place] [address]    Report Status 07/15/2020 FINAL  Final  Blood Culture (routine x 2)     Status: None   Collection Time:  07/10/20  3:15 PM   Specimen: BLOOD LEFT HAND  Result Value Ref Range Status   Specimen Description   Final    BLOOD LEFT HAND Performed at [facility], [address], [place], [place] [address]    Special Requests   Final    BOTTLES DRAWN AEROBIC AND ANAEROBIC Blood Culture results may not be optimal due to an excessive volume of blood received in culture bottles Performed at [facility], [address] [place], [place] [address]    Culture   Final    NO GROWTH 5 DAYS Performed at [facility], [address] [place], [place] [address]    Report Status 07/15/2020 FINAL  Final      Radiology Studies: No results found.  Scheduled Meds: . (feeding supplement) PROSource Plus  30 mL Oral BID BM  . aspirin EC  81 mg Oral Daily  . atorvastatin  80 mg Oral Daily  . baricitinib  4 mg Oral Daily  .  empagliflozin  25 mg Oral Daily  . enoxaparin (LOVENOX) injection  60 mg Subcutaneous QHS  . feeding supplement ([employer] STANDARD 1.4)  325 mL Oral Daily  . insulin aspart  0-20 Units Subcutaneous TID WC  . insulin aspart  0-5 Units Subcutaneous QHS  . insulin aspart  6 Units Subcutaneous TID WC  . insulin detemir  10 Units Subcutaneous BID  . linagliptin  5 mg Oral Daily  . methylPREDNISolone (SOLU-MEDROL) injection  40 mg Intravenous Q6H  . metoprolol succinate  100 mg Oral Daily  . multivitamin with minerals  1 tablet Oral Daily  . sodium chloride flush  3 mL Intravenous Q12H   Continuous Infusions: . sodium chloride       LOS: 6 days   Time spent: 35 minutes.  Tyrone Nine, MD [facility] [email or web address] 07/16/2020, 11:33 AM

## 2020-07-17 LAB — BASIC METABOLIC PANEL
Anion gap: 9 (ref 5–15)
BUN: 40 mg/dL — ABNORMAL HIGH (ref 6–20)
CO2: 29 mmol/L (ref 22–32)
Calcium: 8.4 mg/dL — ABNORMAL LOW (ref 8.9–10.3)
Chloride: 101 mmol/L (ref 98–111)
Creatinine, Ser: 1.04 mg/dL (ref 0.61–1.24)
GFR calc Af Amer: >60 mL/min (ref >60)
GFR calc non Af Amer: >60 mL/min (ref >60)
Glucose, Bld: 182 mg/dL — ABNORMAL HIGH (ref 70–99)
Potassium: 4.9 mmol/L (ref 3.5–5.1)
Sodium: 139 mmol/L (ref 135–145)

## 2020-07-17 LAB — GLUCOSE, CAPILLARY
Glucose-Capillary: 175 mg/dL — ABNORMAL HIGH (ref 70–99)
Glucose-Capillary: 207 mg/dL — ABNORMAL HIGH (ref 70–99)
Glucose-Capillary: 260 mg/dL — ABNORMAL HIGH (ref 70–99)
Glucose-Capillary: 209 mg/dL — ABNORMAL HIGH (ref 70–99)

## 2020-07-17 LAB — C-REACTIVE PROTEIN: CRP: 0.7 mg/dL (ref <1.0)

## 2020-07-17 LAB — D-DIMER, QUANTITATIVE: D-Dimer, Quant: 0.39 ug/mL-FEU (ref 0.00–0.50)

## 2020-07-17 MED ORDER — METHYLPREDNISOLONE SODIUM SUCC 125 MG IJ SOLR
40.0000 mg | Freq: Two times a day (BID) | INTRAMUSCULAR | Status: DC
  Administered 2020-07-17 – 2020-07-19 (×4): 40 mg via INTRAVENOUS
  Filled 2020-07-17 (×4): qty 2

## 2020-07-17 NOTE — Progress Notes (Signed)
PROGRESS NOTE  Shigeru Lampert  OBS:[id number] DOB: 08/14/1961 DOA: 07/10/2020 PCP: Elijio Miles., MD   Brief Narrative: Corbett Moulder is a 59 y.o. male with a history of T2DM, HTN, HLD, obesity who had been admitted 8/11-8/12 for covid-19 pneumonia, discharged with outpatient remdesivir infusions scheduled, and was found to be hypoxic at infusion center on 8/14. 2L O2 was given and patient sent to ED, subsequently admitted.  Assessment & Plan: Active Problems:   Pneumonia due to COVID-19 virus   Essential hypertension   Acute respiratory failure with hypoxia (HCC)   Type 2 diabetes mellitus without complication (HCC)  Acute hypoxemic respiratory failure due to covid-19 pneumonia: SARS-CoV-2 PCR positive on 8/11. - Continue supplemental oxygen. Continues to require 8L O2 at rest. Pt is typical happy hypoxemic. Weaning efforts to continue. - Completed remdesivir (8/11 - 8/15) - Continue steroids, solumedrol to 40mg  q12h today with planned continue taper given durable normalization of CRP, ongoing baricitinib.  - s/p lasix x2 days. No peripheral overload, LV function appeared normal on echo. - Continue baricitinib, started 8/16, for 14 doses or until discharge.  - Encourage OOB, IS, FV, and awake proning if able. Again urged to prone as much as possible. - Continue airborne, contact precautions for 21 days from positive testing. - Enoxaparin prophylactic dose. D-dimer remains wnl.    T2DM: HbA1c 7.4%. Uncontrolled.  - Continue levemir at 15u BID, novolog 8u TIDWC, resistant SSI and continue HS coverage. Will monitor CBGs today with tapering steroid dose and possibly adjust this PM. - Continue linagliptin.  - Dietitian consulted. Carb-modified diet.  - Weaning steroids as above   Hyperlipidemia:  - Continue statin  HTN:  - Continue metoprolol  Obesity: Estimated body mass index is 41.35 kg/m as calculated from the following:   Height as of this encounter: 5\' 7"  (1.702 m).    Weight as of this encounter: 119.7 kg.  DVT prophylaxis: Lovenox 0.5mg /kg q24h. Code Status: Full Family Communication: None at bedside. Does not request a call to family. Disposition Plan:  Status is: Inpatient  Remains inpatient appropriate because:Inpatient level of care appropriate due to severity of illness   Dispo: The patient is from: Home              Anticipated d/c is to: TBD              Anticipated d/c date is: 2 days, will remain inpatient as long as hypoxemia is so severe              Patient currently is not medically stable to d/c. Will require PT/OT evaluations (ordered) and decreased oxygen demand.   Consultants:   None  Procedures:   None  Antimicrobials:  Remdesivir 8/11 - 8/15  Subjective: Got very short of breath in the bathroom earlier today with exertion, this remained until he got back in bed and rested. No chest pain leg swelling or other new complaints.   Objective: Vitals:   07/16/20 1244 07/16/20 2127 07/17/20 0611 07/17/20 0757  BP: 124/77 135/78 118/75   Pulse: 62 61 (!) 48   Resp: 18 20 20    Temp: 97.6 F (36.4 C) 98 F (36.7 C) 98 F (36.7 C)   TempSrc: Oral     SpO2: 90% (!) 85% 91% (!) 88%  Weight:      Height:        Intake/Output Summary (Last 24 hours) at 07/17/2020 1043 Last data filed at 07/16/2020 1534 Gross per 24 hour  Intake  460 ml  Output --  Net 460 ml   Filed Weights   07/11/20 1948  Weight: 119.7 kg   Gen: 59 y.o. male in no distress Pulm: Nonlabored breathing 8L O2, SpO2 in 88-91% range at rest, no respiratory distress. Diminished. CV: Regular rate and rhythm. No murmur, rub, or gallop. No JVD, no dependent edema. GI: Abdomen soft, non-tender, non-distended, with normoactive bowel sounds.  Ext: Warm, no deformities Skin: No rashes, lesions or ulcers on visualized skin. Neuro: Alert and oriented. No focal neurological deficits. Psych: Judgement and insight appear fair. Mood euthymic & affect congruent.  Behavior is appropriate.    Data Reviewed: I have personally reviewed following labs and imaging studies  CBC: Recent Labs  Lab 07/10/20 1505 07/11/20 0500 07/12/20 0436 07/13/20 0452 07/14/20 0425  WBC 7.7 6.3 7.8 5.9 8.8  NEUTROABS 6.7 5.2 6.3 4.8 7.0  HGB 14.9 14.4 14.7 14.8 15.5  HCT 44.9 43.9 45.6 45.4 47.5  MCV 91.1 91.5 92.1 92.3 92.4  PLT 187 198 232 243 345   Basic Metabolic Panel: Recent Labs  Lab 07/12/20 0436 07/13/20 0452 07/14/20 0425 07/15/20 0432 07/17/20 0445  NA 140 139 143 138 139  K 4.5 4.5 4.5 4.2 4.9  CL 102 103 102 103 101  CO2 GLUCOSE 149* 162* 218* 177* 182*  BUN 23* 26* 35* 39* 40*  CREATININE 0.93 0.94 1.09 0.97 1.04  CALCIUM 8.2* 8.2* 8.7* 8.2* 8.4*  PHOS  --   --   --  5.1*  --    GFR: Estimated Creatinine Clearance: 95.9 mL/min (by C-G formula based on SCr of 1.04 mg/dL). Liver Function Tests: Recent Labs  Lab 07/10/20 1505 07/10/20 1505 07/11/20 0500 07/12/20 0436 07/13/20 0452 07/14/20 0425 07/15/20 0432  AST 59*  --  45* --   ALT 30  --  --   ALKPHOS 67  --  64 61 61 66  --   BILITOT 0.8  --  1.0 0.8 0.9 0.6  --   PROT 6.0*  --  6.0* 6.0* 5.9* 6.5  --   ALBUMIN 3.0*   < > 3.1* 3.1* 2.9* 3.2* 3.1*   < > = values in this interval not displayed.   CBG: Recent Labs  Lab 07/16/20 0755 07/16/20 1207 07/16/20 1737 07/16/20 2124 07/17/20 0801  GLUCAP 217* 239* 264* 310* 175*   Urine analysis:    Component Value Date/Time   COLORURINE YELLOW 07/08/2020 0217   APPEARANCEUR CLEAR 07/08/2020 0217   LABSPEC 1.033 (H) 07/08/2020 0217   PHURINE 5.0 07/08/2020 0217   GLUCOSEU >=500 (A) 07/08/2020 0217   HGBUR NEGATIVE 07/08/2020 0217   BILIRUBINUR NEGATIVE 07/08/2020 0217   KETONESUR NEGATIVE 07/08/2020 0217   PROTEINUR NEGATIVE 07/08/2020 0217   NITRITE NEGATIVE 07/08/2020 0217   LEUKOCYTESUR NEGATIVE 07/08/2020 0217   Recent Results (from the past 240 hour(s))  Blood Culture  (routine x 2)     Status: None   Collection Time: 07/07/20  4:38 PM   Specimen: BLOOD  Result Value Ref Range Status   Specimen Description   Final    BLOOD RIGHT ANTECUBITAL Performed at [facility], [address] [place], [place] [address]    Special Requests   Final    BOTTLES DRAWN AEROBIC AND ANAEROBIC Blood Culture adequate volume Performed at [facility], [address], [place], [place] [address]  Culture   Final    NO GROWTH 5 DAYS Performed at [facility], [address], [place], [place] [address]    Report Status 07/12/2020 FINAL  Final  SARS Coronavirus 2 by RT PCR (hospital order, performed in [facility] hospital lab) Nasopharyngeal Nasopharyngeal Swab     Status: Abnormal   Collection Time: 07/07/20  4:38 PM   Specimen: Nasopharyngeal Swab  Result Value Ref Range Status   SARS Coronavirus 2 POSITIVE (A) NEGATIVE Final    Comment: RESULT CALLED TO, READ BACK BY AND VERIFIED WITH: MARCUM RUTH RN AT 1740 ON 07/07/20 BY [email or web address] (NOTE) SARS-CoV-2 target nucleic acids are DETECTED  SARS-CoV-2 RNA is generally detectable in upper respiratory specimens  during the acute phase of infection.  Positive results are indicative  of the presence of the identified virus, but do not rule out bacterial infection or co-infection with other pathogens not detected by the test.  Clinical correlation with patient history and  other diagnostic information is necessary to determine patient infection status.  The expected result is negative.  Fact Sheet for Patients:   BoilerBrush.com.cy   Fact Sheet for Healthcare Providers:   https://pope.com/    This test is not yet approved or cleared by the [place] FDA and  has been authorized for detection and/or diagnosis of SARS-CoV-2 by FDA under an Emergency Use Authorization (EUA).  This EUA will remain in effect (meani ng this test can be used) for the  duration of  the COVID-19 declaration under Section 564(b)(1) of the Act, 21 U.S.C. section 360-bbb-3(b)(1), unless the authorization is terminated or revoked sooner.  Performed at [facility], [address] [place], [place] [address]   Blood Culture (routine x 2)     Status: None   Collection Time: 07/07/20  4:58 PM   Specimen: BLOOD  Result Value Ref Range Status   Specimen Description   Final    BLOOD BLOOD RIGHT HAND Performed at [facility], [address] [place], [place] [address]    Special Requests   Final    BOTTLES DRAWN AEROBIC AND ANAEROBIC Blood Culture adequate volume Performed at [facility], [address] [place], [place] [address]    Culture   Final    NO GROWTH 5 DAYS Performed at [facility], [address] [place], [place] [address]    Report Status 07/12/2020 FINAL  Final  Respiratory Panel by PCR     Status: None   Collection Time: 07/07/20 10:32 PM   Specimen: Nasopharyngeal Swab; Respiratory  Result Value Ref Range Status   Adenovirus NOT DETECTED NOT DETECTED Final   Coronavirus 229E NOT DETECTED NOT DETECTED Final    Comment: (NOTE) The Coronavirus on the Respiratory Panel, DOES NOT test for the novel  Coronavirus (2019 nCoV)    Coronavirus HKU1 NOT DETECTED NOT DETECTED Final   Coronavirus NL63 NOT DETECTED NOT DETECTED Final   Coronavirus OC43 NOT DETECTED NOT DETECTED Final   Metapneumovirus NOT DETECTED NOT DETECTED Final   Rhinovirus / Enterovirus NOT DETECTED NOT DETECTED Final   Influenza A NOT DETECTED NOT DETECTED Final   Influenza B NOT DETECTED NOT DETECTED Final   Parainfluenza Virus 1 NOT DETECTED NOT DETECTED Final   Parainfluenza Virus 2 NOT DETECTED NOT DETECTED Final   Parainfluenza Virus 3 NOT DETECTED NOT DETECTED Final   Parainfluenza Virus 4 NOT DETECTED NOT DETECTED Final   Respiratory Syncytial Virus  NOT DETECTED NOT DETECTED Final   Bordetella pertussis NOT DETECTED NOT  DETECTED Final   Chlamydophila pneumoniae NOT DETECTED NOT DETECTED Final   Mycoplasma pneumoniae NOT DETECTED NOT DETECTED Final    Comment: Performed at [facility], [address] [place], [place] [address]  Culture, blood (Routine X 2) w Reflex to ID Panel     Status: None   Collection Time: 07/07/20 11:13 PM   Specimen: BLOOD  Result Value Ref Range Status   Specimen Description   Final    BLOOD BLOOD LEFT HAND Performed at [facility], [address] [place], [place] [address]    Special Requests   Final    BOTTLES DRAWN AEROBIC ONLY Blood Culture adequate volume Performed at [facility], [address] [place], [place] [address]    Culture   Final    NO GROWTH 5 DAYS Performed at [facility], [address], [place], [place] [address]    Report Status 07/13/2020 FINAL  Final  Culture, blood (Routine X 2) w Reflex to ID Panel     Status: None   Collection Time: 07/07/20 11:13 PM   Specimen: BLOOD  Result Value Ref Range Status   Specimen Description   Final    BLOOD RIGHT ANTECUBITAL Performed at [facility], [address] [place], [place] [address]    Special Requests   Final    BOTTLES DRAWN AEROBIC AND ANAEROBIC Blood Culture adequate volume Performed at [facility], [address] [place], [place] [address]    Culture   Final    NO GROWTH 5 DAYS Performed at [facility], [address], [place], [place] [address]    Report Status 07/13/2020 FINAL  Final  Urine culture     Status: None   Collection Time: 07/08/20  2:17 AM   Specimen: In/Out Cath Urine  Result Value Ref Range Status   Specimen Description   Final    IN/OUT CATH URINE Performed at [facility], [address] [place], [place] [address]    Special Requests   Final    NONE Performed at [facility], [address] [place], [place] [address]    Culture   Final    NO  GROWTH Performed at [facility], [address], [place], [place] [address]    Report Status 07/08/2020 FINAL  Final  Blood Culture (routine x 2)     Status: None   Collection Time: 07/10/20  3:14 PM   Specimen: BLOOD  Result Value Ref Range Status   Specimen Description   Final    BLOOD LEFT ANTECUBITAL Performed at [facility], [address], [place], [place] [address]    Special Requests   Final    BOTTLES DRAWN AEROBIC AND ANAEROBIC Blood Culture adequate volume Performed at [facility], [address], [place], [place] [address]    Culture   Final    NO GROWTH 5 DAYS Performed at [facility], [address], [place], [place] [address]    Report Status 07/15/2020 FINAL  Final  Blood Culture (routine x 2)     Status: None   Collection Time: 07/10/20  3:15 PM   Specimen: BLOOD LEFT HAND  Result Value Ref Range Status   Specimen Description   Final    BLOOD LEFT HAND Performed at [facility], [address] [place], [place] [address]    Special Requests   Final    BOTTLES DRAWN AEROBIC AND ANAEROBIC Blood Culture results may not be optimal due to an excessive volume of blood received in culture bottles Performed at [facility], [address] [place], [place] [address]    Culture   Final    NO GROWTH 5 DAYS Performed at [facility], [address] [place], [place] [address]    Report Status 07/15/2020 FINAL  Final      Radiology Studies: No results found.  Scheduled Meds: . (feeding supplement) PROSource Plus  30 mL Oral BID BM  . aspirin EC  81 mg Oral Daily  . atorvastatin  80 mg Oral Daily  . baricitinib  4 mg Oral Daily  . empagliflozin  25 mg Oral Daily  . enoxaparin (LOVENOX) injection  60 mg Subcutaneous QHS  . feeding supplement (KATE FARMS STANDARD 1.4)  325 mL Oral Daily  . insulin aspart  0-20 Units Subcutaneous TID WC  . insulin aspart  0-5 Units Subcutaneous  QHS  . insulin aspart  8 Units Subcutaneous TID WC  . insulin detemir  15 Units Subcutaneous BID  . linagliptin  5 mg Oral Daily  . methylPREDNISolone (SOLU-MEDROL) injection  40 mg Intravenous Q12H  . metoprolol succinate  100 mg Oral Daily  . multivitamin with minerals  1 tablet Oral Daily  . sodium chloride flush  3 mL Intravenous Q12H   Continuous Infusions: . sodium chloride       LOS: 7 days   Time spent: 25 minutes.  Tyrone Nineyan B Yailin Biederman, MD [facility] [email or web address] 07/17/2020, 10:43 AM

## 2020-07-18 LAB — GLUCOSE, CAPILLARY
Glucose-Capillary: 209 mg/dL — ABNORMAL HIGH (ref 70–99)
Glucose-Capillary: 222 mg/dL — ABNORMAL HIGH (ref 70–99)
Glucose-Capillary: 168 mg/dL — ABNORMAL HIGH (ref 70–99)
Glucose-Capillary: 142 mg/dL — ABNORMAL HIGH (ref 70–99)

## 2020-07-18 MED ORDER — INSULIN DETEMIR 100 UNIT/ML ~~LOC~~ SOLN
18.0000 [IU] | Freq: Two times a day (BID) | SUBCUTANEOUS | Status: DC
  Administered 2020-07-18 – 2020-07-22 (×9): 18 [IU] via SUBCUTANEOUS
  Filled 2020-07-18 (×10): qty 0.18

## 2020-07-18 MED ORDER — INSULIN ASPART 100 UNIT/ML ~~LOC~~ SOLN
10.0000 [IU] | Freq: Three times a day (TID) | SUBCUTANEOUS | Status: DC
  Administered 2020-07-18 – 2020-07-22 (×12): 10 [IU] via SUBCUTANEOUS

## 2020-07-18 NOTE — Progress Notes (Signed)
PROGRESS NOTE  Jake Mosley  TGG:[id number] DOB: [birth year] DOA: 07/10/2020 PCP: Jake Mosley., MD   Brief Narrative: Jake Mosley is a 59 y.o. male with a history of T2DM, HTN, HLD, obesity who had been admitted 8/11-8/12 for covid-19 pneumonia, discharged with outpatient remdesivir infusions scheduled, and was found to be hypoxic at infusion center on 8/14. 2L O2 was given and patient sent to ED, subsequently admitted.  Assessment & Plan: Active Problems:   Pneumonia due to COVID-19 virus   Essential hypertension   Acute respiratory failure with hypoxia (HCC)   Type 2 diabetes mellitus without complication (HCC)  Acute hypoxemic respiratory failure due to covid-19 pneumonia: SARS-CoV-2 PCR positive on 8/11. - Continue supplemental oxygen. Continues to require 8L O2 at rest. Pt is typical happy hypoxemic. Weaning efforts to continue. - Completed remdesivir (8/11 - 8/15) - Continue steroids, solumedrol to 40mg  q12h with plans to taper to daily with ongoing baricitinib on 8/23 - s/p lasix x2 days. No peripheral overload, LV function appeared normal on echo. - Continue baricitinib, started 8/16, for 14 doses or until discharge.  - Encourage OOB, IS, FV, and awake proning if able. Again urged to prone as much as possible. - Continue airborne, contact precautions for 21 days from positive testing. - Enoxaparin prophylactic dose. D-dimer remains wnl.    T2DM: HbA1c 7.4%. Uncontrolled.  - Increase levemir to 18u BID, novolog 10u TIDWC, continue resistant SSI and continue HS coverage. Will monitor CBGs  - Continue linagliptin.  - Dietitian consulted. Carb-modified diet.  - Weaning steroids as above   Hyperlipidemia:  - Continue statin  HTN:  - Continue metoprolol  Obesity: Estimated body mass index is 41.35 kg/m as calculated from the following:   Height as of this encounter: 5\' 7"  (1.702 m).   Weight as of this encounter: 119.7 kg.  DVT prophylaxis: Lovenox 0.5mg /kg  q24h. Code Status: Full Family Communication: None at bedside. Does not request a call to family. Disposition Plan:  Status is: Inpatient  Remains inpatient appropriate because:Inpatient level of care appropriate due to severity of illness   Dispo: The patient is from: Home              Anticipated d/c is to: TBD              Anticipated d/c date is: 3 days, will remain inpatient as long as hypoxemia is so severe              Patient currently is not medically stable to d/c. Will require PT/OT evaluations (ordered) and decreased oxygen demand.   Consultants:   None  Procedures:   None  Antimicrobials:  Remdesivir 8/11 - 8/15  Subjective: Short of breath only with exertion, though we've weaned him to 7L and tele monitoring demonstrates stable SpO2 in 80%'s at rest without reports of dyspnea. He's feeling generally better. Had some confusion about where he was this morning briefly, but that cleared up quickly.  Objective: Vitals:   07/17/20 1149 07/18/20 0101 07/18/20 0608 07/18/20 0738  BP:  (!) 104/49 127/73   Pulse:  (!) 47 60   Resp: 20 20 20    Temp: 97.9 F (36.6 C) 97.8 F (36.6 C) 97.8 F (36.6 C)   TempSrc: Oral Oral Oral   SpO2:  91% 91% (!) 88%  Weight:      Height:       No intake or output data in the 24 hours ending 07/18/20 1201 Filed Weights   07/11/20 1948  Weight: 119.7 kg   Gen: 59 y.o. male in no distress Pulm: Nonlabored breathing HFNC at rest, diminished. CV: Regular rate and rhythm. No murmur, rub, or gallop. No JVD, no dependent edema. GI: Abdomen soft, non-tender, non-distended, with normoactive bowel sounds.  Ext: Warm, no deformities Skin: No rashes, lesions or ulcers on visualized skin. Neuro: Alert and oriented. No focal neurological deficits. Psych: Judgement and insight appear fair. Mood euthymic & affect congruent. Behavior is appropriate.    Data Reviewed: I have personally reviewed following labs and imaging  studies  CBC: Recent Labs  Lab 07/12/20 0436 07/13/20 0452 07/14/20 0425  WBC 7.8 5.9 8.8  NEUTROABS 6.3 4.8 7.0  HGB 14.7 14.8 15.5  HCT 45.6 45.4 47.5  MCV 92.1 92.3 92.4  PLT 232 243 345   Basic Metabolic Panel: Recent Labs  Lab 07/12/20 0436 07/13/20 0452 07/14/20 0425 07/15/20 0432 07/17/20 0445  NA 140 139 143 138 139  K 4.5 4.5 4.5 4.2 4.9  CL 102 103 102 103 101  CO2 27 23 31 26 29   GLUCOSE 149* 162* 218* 177* 182*  BUN 23* 26* 35* 39* 40*  CREATININE 0.93 0.94 1.09 0.97 1.04  CALCIUM 8.2* 8.2* 8.7* 8.2* 8.4*  PHOS  --   --   --  5.1*  --    GFR: Estimated Creatinine Clearance: 95.9 mL/min (by C-G formula based on SCr of 1.04 mg/dL). Liver Function Tests: Recent Labs  Lab 07/12/20 0436 07/13/20 0452 07/14/20 0425 07/15/20 0432  AST 27 21 26   --   ALT 22 18 21   --   ALKPHOS 61 61 66  --   BILITOT 0.8 0.9 0.6  --   PROT 6.0* 5.9* 6.5  --   ALBUMIN 3.1* 2.9* 3.2* 3.1*   CBG: Recent Labs  Lab 07/17/20 1146 07/17/20 1644 07/17/20 2111 07/18/20 0735 07/18/20 1126  GLUCAP 207* 260* 209* 209* 222*   Urine analysis:    Component Value Date/Time   COLORURINE YELLOW 07/08/2020 0217   APPEARANCEUR CLEAR 07/08/2020 0217   LABSPEC 1.033 (H) 07/08/2020 0217   PHURINE 5.0 07/08/2020 0217   GLUCOSEU >=500 (A) 07/08/2020 0217   HGBUR NEGATIVE 07/08/2020 0217   BILIRUBINUR NEGATIVE 07/08/2020 0217   KETONESUR NEGATIVE 07/08/2020 0217   PROTEINUR NEGATIVE 07/08/2020 0217   NITRITE NEGATIVE 07/08/2020 0217   LEUKOCYTESUR NEGATIVE 07/08/2020 0217   Recent Results (from the past 240 hour(s))  Blood Culture (routine x 2)     Status: None   Collection Time: 07/10/20  3:14 PM   Specimen: BLOOD  Result Value Ref Range Status   Specimen Description   Final    BLOOD LEFT ANTECUBITAL Performed at [facility], [address] [place], [facility] [address]    Special Requests   Final    BOTTLES DRAWN AEROBIC AND ANAEROBIC Blood Culture adequate  volume Performed at [facility], [address], [place], [place]    Culture   Final    NO GROWTH 5 DAYS Performed at [facility], [address], [place], [facility] [address]    Report Status 07/15/2020 FINAL  Final  Blood Culture (routine x 2)     Status: None   Collection Time: 07/10/20  3:15 PM   Specimen: BLOOD LEFT HAND  Result Value Ref Range Status   Specimen Description   Final    BLOOD LEFT HAND Performed at [facility], [address], [place], [place]  Special Requests   Final    BOTTLES DRAWN AEROBIC AND ANAEROBIC Blood Culture results may not be optimal due to an excessive volume of blood received in culture bottles Performed at [facility], [address] [place], [place] [address]    Culture   Final    NO GROWTH 5 DAYS Performed at [facility], [address] [place], [place] [address]    Report Status 07/15/2020 FINAL  Final      Radiology Studies: No results found.  Scheduled Meds: . (feeding supplement) PROSource Plus  30 mL Oral BID BM  . aspirin EC  81 mg Oral Daily  . atorvastatin  80 mg Oral Daily  . baricitinib  4 mg Oral Daily  . empagliflozin  25 mg Oral Daily  . enoxaparin (LOVENOX) injection  60 mg Subcutaneous QHS  . feeding supplement (KATE FARMS STANDARD 1.4)  325 mL Oral Daily  . insulin aspart  0-20 Units Subcutaneous TID WC  . insulin aspart  0-5 Units Subcutaneous QHS  . insulin aspart  10 Units Subcutaneous TID WC  . insulin detemir  18 Units Subcutaneous BID  . linagliptin  5 mg Oral Daily  . methylPREDNISolone (SOLU-MEDROL) injection  40 mg Intravenous Q12H  . metoprolol succinate  100 mg Oral Daily  . multivitamin with minerals  1 tablet Oral Daily  . sodium chloride flush  3 mL Intravenous Q12H   Continuous Infusions: . sodium chloride       LOS: 8 days   Time spent: 25 minutes.  Tyrone Nine, MD [facility] [email or web address] 07/18/2020, 12:01 PM

## 2020-07-19 LAB — GLUCOSE, CAPILLARY
Glucose-Capillary: 127 mg/dL — ABNORMAL HIGH (ref 70–99)
Glucose-Capillary: 174 mg/dL — ABNORMAL HIGH (ref 70–99)
Glucose-Capillary: 244 mg/dL — ABNORMAL HIGH (ref 70–99)
Glucose-Capillary: 207 mg/dL — ABNORMAL HIGH (ref 70–99)

## 2020-07-19 MED ORDER — METHYLPREDNISOLONE SODIUM SUCC 125 MG IJ SOLR: 40.0000 mg | Freq: Every day | INTRAMUSCULAR | Status: DC

## 2020-07-19 MED ORDER — METHYLPREDNISOLONE SODIUM SUCC 125 MG IJ SOLR
40.0000 mg | Freq: Every day | INTRAMUSCULAR | Status: DC
  Filled 2020-07-19: qty 2

## 2020-07-19 NOTE — Progress Notes (Signed)
PROGRESS NOTE  Jake Mosley  IRS:[id number] DOB: [birth year] DOA: 07/10/2020 PCP: Elijio Miles., MD   Brief Narrative: Jake Mosley is a 59 y.o. male with a history of T2DM, HTN, HLD, obesity who had been admitted 8/11-8/12 for covid-19 pneumonia, discharged with outpatient remdesivir infusions scheduled, and was found to be hypoxic at infusion center on 8/14. 2L O2 was given and patient sent to ED, subsequently admitted.  Assessment & Plan: Active Problems:   Pneumonia due to COVID-19 virus   Essential hypertension   Acute respiratory failure with hypoxia (HCC)   Type 2 diabetes mellitus without complication (HCC)  Acute hypoxemic respiratory failure due to covid-19 pneumonia: SARS-CoV-2 PCR positive on 8/11. - Continue supplemental oxygen. Continues to require 6L O2. Pt is typical happy hypoxemic. Weaning efforts to continue. - Completed remdesivir (8/11 - 8/15) - Continue steroids, taper to daily solumedrol 40mg   - s/p lasix x2 days. No peripheral overload, LV function appeared normal on echo. - Continue baricitinib, started 8/16, for 14 doses or until discharge.  - Encourage OOB, IS, FV, and awake proning if able. Again urged to prone as much as possible. - Continue airborne, contact precautions for 21 days from positive testing. - Enoxaparin prophylactic dose. D-dimer remains wnl.    T2DM: HbA1c 7.4%. Uncontrolled.  - Cotninue levemir 18u BID, novolog 10u TIDWC,  resistant SSI and continue HS coverage. Will monitor CBGs with tapering steroids. - Continue linagliptin.  - Dietitian consulted. Carb-modified diet.  - Weaning steroids as above   Hyperlipidemia:  - Continue statin  HTN:  - Continue metoprolol  Obesity: Estimated body mass index is 41.35 kg/m as calculated from the following:   Height as of this encounter: 5\' 7"  (1.702 m).   Weight as of this encounter: 119.7 kg.  DVT prophylaxis: Lovenox 0.5mg /kg q24h. Code Status: Full Family Communication: None at  bedside. Does not request a call to family. Disposition Plan:  Status is: Inpatient  Remains inpatient appropriate because:Inpatient level of care appropriate due to severity of illness   Dispo: The patient is from: Home              Anticipated d/c is to: TBD              Anticipated d/c date is: 2 - 3 days, will remain inpatient as long as hypoxemia is so severe              Patient currently is not medically stable to d/c. Will require PT/OT evaluations (ordered) and decreased oxygen demand.   Consultants:   None  Procedures:   None  Antimicrobials:  Remdesivir 8/11 - 8/15  Subjective: No new complaints, shortness of breath remains severe with exertion but able to come down on oxygen slowly. No chest pain or dyspnea.  Objective: Vitals:   07/18/20 0738 07/18/20 1413 07/18/20 2202 07/19/20 0654  BP:  127/90 119/84 117/80  Pulse:  79 64 (!) 58  Resp:  20 18 20   Temp:  97.8 F (36.6 C) 98.1 F (36.7 C) 98.1 F (36.7 C)  TempSrc:  Oral Oral Oral  SpO2: (!) 88% (!) 87% 90% (!) 88%  Weight:      Height:        Intake/Output Summary (Last 24 hours) at 07/19/2020 1135 Last data filed at 07/19/2020 0900 Gross per 24 hour  Intake 1200 ml  Output --  Net 1200 ml   Filed Weights   07/11/20 1948  Weight: 119.7 kg   Gen: 59 y.o.  male in no distress Pulm: Nonlabored breathing supplemental oxygen. clearing crackles. CV: Regular rate and rhythm. No murmur, rub, or gallop. No JVD, no dependent edema. GI: Abdomen soft, non-tender, non-distended, with normoactive bowel sounds.  Ext: Warm, no deformities Skin: No rashes, lesions or ulcers on visualized skin. Neuro: Alert and oriented. No focal neurological deficits. Psych: Judgement and insight appear fair. Mood euthymic & affect congruent. Behavior is appropriate  Data Reviewed: I have personally reviewed following labs and imaging studies  CBC: Recent Labs  Lab 07/13/20 0452 07/14/20 0425  WBC 5.9 8.8  NEUTROABS  4.8 7.0  HGB 14.8 15.5  HCT 45.4 47.5  MCV 92.3 92.4  PLT 243 345   Basic Metabolic Panel: Recent Labs  Lab 07/13/20 0452 07/14/20 0425 07/15/20 0432 07/17/20 0445  NA 139 143 138 139  K 4.5 4.5 4.2 4.9  CL 103 102 103 101  CO2 23 31 26 29   GLUCOSE 162* 218* 177* 182*  BUN 26* 35* 39* 40*  CREATININE 0.94 1.09 0.97 1.04  CALCIUM 8.2* 8.7* 8.2* 8.4*  PHOS  --   --  5.1*  --    GFR: Estimated Creatinine Clearance: 95.9 mL/min (by C-G formula based on SCr of 1.04 mg/dL). Liver Function Tests: Recent Labs  Lab 07/13/20 0452 07/14/20 0425 07/15/20 0432  AST 21 26  --   ALT 18 21  --   ALKPHOS 61 66  --   BILITOT 0.9 0.6  --   PROT 5.9* 6.5  --   ALBUMIN 2.9* 3.2* 3.1*   CBG: Recent Labs  Lab 07/18/20 0735 07/18/20 1126 07/18/20 1621 07/18/20 2159 07/19/20 0815  GLUCAP 209* 222* 168* 142* 127*   Urine analysis:    Component Value Date/Time   COLORURINE YELLOW 07/08/2020 0217   APPEARANCEUR CLEAR 07/08/2020 0217   LABSPEC 1.033 (H) 07/08/2020 0217   PHURINE 5.0 07/08/2020 0217   GLUCOSEU >=500 (A) 07/08/2020 0217   HGBUR NEGATIVE 07/08/2020 0217   BILIRUBINUR NEGATIVE 07/08/2020 0217   KETONESUR NEGATIVE 07/08/2020 0217   PROTEINUR NEGATIVE 07/08/2020 0217   NITRITE NEGATIVE 07/08/2020 0217   LEUKOCYTESUR NEGATIVE 07/08/2020 0217   Recent Results (from the past 240 hour(s))  Blood Culture (routine x 2)     Status: None   Collection Time: 07/10/20  3:14 PM   Specimen: BLOOD  Result Value Ref Range Status   Specimen Description   Final    BLOOD LEFT ANTECUBITAL Performed at [facility], [address], [place], [place]    Special Requests   Final    BOTTLES DRAWN AEROBIC AND ANAEROBIC Blood Culture adequate volume Performed at [facility], [address], [place], [place]    Culture   Final    NO GROWTH 5 DAYS Performed at [facility], [address] [place], [place]    Report  Status 07/15/2020 FINAL  Final  Blood Culture (routine x 2)     Status: None   Collection Time: 07/10/20  3:15 PM   Specimen: BLOOD LEFT HAND  Result Value Ref Range Status   Specimen Description   Final    BLOOD LEFT HAND Performed at [facility], [address] [place], [place]    Special Requests   Final    BOTTLES DRAWN AEROBIC AND ANAEROBIC Blood Culture results may not be optimal due to an excessive volume of blood received in culture bottles Performed at [facility],  [address] [place], [place] [address]    Culture   Final    NO GROWTH 5 DAYS Performed at [facility], [address] [place], [place] [address]    Report Status 07/15/2020 FINAL  Final      Radiology Studies: No results found.  Scheduled Meds: . (feeding supplement) PROSource Plus  30 mL Oral BID BM  . aspirin EC  81 mg Oral Daily  . atorvastatin  80 mg Oral Daily  . baricitinib  4 mg Oral Daily  . empagliflozin  25 mg Oral Daily  . enoxaparin (LOVENOX) injection  60 mg Subcutaneous QHS  . feeding supplement (KATE FARMS STANDARD 1.4)  325 mL Oral Daily  . insulin aspart  0-20 Units Subcutaneous TID WC  . insulin aspart  0-5 Units Subcutaneous QHS  . insulin aspart  10 Units Subcutaneous TID WC  . insulin detemir  18 Units Subcutaneous BID  . linagliptin  5 mg Oral Daily  . [START ON 07/20/2020] methylPREDNISolone (SOLU-MEDROL) injection  40 mg Intravenous Daily  . metoprolol succinate  100 mg Oral Daily  . multivitamin with minerals  1 tablet Oral Daily  . sodium chloride flush  3 mL Intravenous Q12H   Continuous Infusions: . sodium chloride       LOS: 9 days   Time spent: 25 minutes.  Tyrone Nine, MD [facility] [email or web address] 07/19/2020, 11:35 AM

## 2020-07-20 LAB — GLUCOSE, CAPILLARY
Glucose-Capillary: 84 mg/dL (ref 70–99)
Glucose-Capillary: 213 mg/dL — ABNORMAL HIGH (ref 70–99)
Glucose-Capillary: 148 mg/dL — ABNORMAL HIGH (ref 70–99)
Glucose-Capillary: 173 mg/dL — ABNORMAL HIGH (ref 70–99)

## 2020-07-20 MED ORDER — AMOXICILLIN-POT CLAVULANATE 875-125 MG PO TABS
1.0000 | ORAL_TABLET | Freq: Two times a day (BID) | ORAL | Status: DC
  Administered 2020-07-20 – 2020-07-22 (×5): 1 via ORAL
  Filled 2020-07-20 (×5): qty 1

## 2020-07-20 NOTE — Progress Notes (Addendum)
Decreased O2 to 5 liters sat 94%, sitting on SOB eatting breakfast, c/o of toothache right upper tooth/gum, will cont to monitor O2 tolerance and medicate w/Tylenol for toothache SRP, RN

## 2020-07-20 NOTE — Progress Notes (Signed)
PROGRESS NOTE  Jake Mosley  MVH:[id number] DOB: 28-Sep-1961 DOA: 07/10/2020 PCP: Elijio Miles., MD   Brief Narrative: Jake Mosley is a 59 y.o. male with a history of T2DM, HTN, HLD, obesity who had been admitted 8/11-8/12 for covid-19 pneumonia, discharged with outpatient remdesivir infusions scheduled, and was found to be hypoxic at infusion center on 8/14. 2L O2 was given and patient sent to ED, subsequently admitted.  Assessment & Plan: Active Problems:   Pneumonia due to COVID-19 virus   Essential hypertension   Acute respiratory failure with hypoxia (HCC)   Type 2 diabetes mellitus without complication (HCC)  Acute hypoxemic respiratory failure due to covid-19 pneumonia: SARS-CoV-2 PCR positive on 8/11. - Continue supplemental oxygen. Continues to require 6L O2. Pt is typical happy hypoxemic. Weaning efforts to continue. - Completed remdesivir (8/11 - 8/15) - Will stop steroids now that it's been 12 days Tx, CRP normalized on 8/18.  - s/p lasix x2 days. No peripheral overload, LV function appeared normal on echo. - Stop baricitinib, started 8/16, due to possible developing dental abscess. - Encourage OOB, IS, FV, and awake proning if able. Again urged to prone as much as possible. - Continue airborne, contact precautions for 21 days from positive testing. - Enoxaparin prophylactic dose. D-dimer remains wnl.    Dental caries, odontogenic abscess: At high risk of worsening infection in setting of steroids and baricitinib which will be stopped.  - Augmentin started.  T2DM: HbA1c 7.4%. Uncontrolled.  - Continue levemir 18u BID, novolog 10u TIDWC,  resistant SSI and continue HS coverage. Will monitor CBGs with tapering steroids. - Continue linagliptin.  - Dietitian consulted. Carb-modified diet.  - Weaning steroids as above   Hyperlipidemia:  - Continue statin  HTN:  - Continue metoprolol  Obesity: Estimated body mass index is 41.35 kg/m as calculated from the  following:   Height as of this encounter: 5\' 7"  (1.702 m).   Weight as of this encounter: 119.7 kg.  DVT prophylaxis: Lovenox 0.5mg /kg q24h. Code Status: Full Family Communication: None at bedside. Does not request a call to family. Disposition Plan:  Status is: Inpatient  Remains inpatient appropriate because:Inpatient level of care appropriate due to severity of illness  Dispo: The patient is from: Home              Anticipated d/c is to: Home              Anticipated d/c date is: 1 - 2 days, will remain inpatient as long as hypoxemia is so severe              Patient currently is not medically stable to d/c. Will require PT/OT evaluations (ordered) and decreased oxygen demand.   Consultants:   None  Procedures:   None  Antimicrobials:  Remdesivir 8/11 - 8/15  Subjective: Shortness of breath is ok, improving. Still desats with weaning trials. Woke up last night with pain along upper right jaw typical of previous infections. No fevers, pain improved with tylenol but remains. Usually abates with antibiotics.   Objective: Vitals:   07/19/20 2210 07/20/20 0656 07/20/20 0915 07/20/20 1111  BP: 115/74 98/64  (!) 144/113  Pulse: 61 (!) 56  75  Resp: 18 20  17   Temp: 97.9 F (36.6 C) 98.1 F (36.7 C)  98.2 F (36.8 C)  TempSrc: Oral Oral  Oral  SpO2: (!) 89% (!) 89% 91% (!) 88%  Weight:      Height:  Intake/Output Summary (Last 24 hours) at 07/20/2020 1416 Last data filed at 07/19/2020 1839 Gross per 24 hour  Intake 240 ml  Output --  Net 240 ml   Filed Weights   07/11/20 1948  Weight: 119.7 kg   Gen: 59 y.o. male in no distress HEENT: Edentulous with multiple broken teeth, gingivitis, no exudate. Pulm: Nonlabored breathing 5L O2, SpO2 88% while speaking at rest. Diminished. CV: Regular rate and rhythm. No murmur, rub, or gallop. No JVD, no dependent edema. GI: Abdomen soft, non-tender, non-distended, with normoactive bowel sounds.  Ext: Warm, no  deformities Skin: No rashes, lesions or ulcers on visualized skin. Neuro: Alert and oriented. No focal neurological deficits. Psych: Judgement and insight appear fair. Mood euthymic & affect congruent. Behavior is appropriate.    Data Reviewed: I have personally reviewed following labs and imaging studies  CBC: Recent Labs  Lab 07/14/20 0425  WBC 8.8  NEUTROABS 7.0  HGB 15.5  HCT 47.5  MCV 92.4  PLT 345   Basic Metabolic Panel: Recent Labs  Lab 07/14/20 0425 07/15/20 0432 07/17/20 0445  NA 143 138 139  K 4.5 4.2 4.9  CL 102 103 101  CO2 31 26 29   GLUCOSE 218* 177* 182*  BUN 35* 39* 40*  CREATININE 1.09 0.97 1.04  CALCIUM 8.7* 8.2* 8.4*  PHOS  --  5.1*  --    GFR: Estimated Creatinine Clearance: 95.9 mL/min (by C-G formula based on SCr of 1.04 mg/dL). Liver Function Tests: Recent Labs  Lab 07/14/20 0425 07/15/20 0432  AST 26  --   ALT 21  --   ALKPHOS 66  --   BILITOT 0.6  --   PROT 6.5  --   ALBUMIN 3.2* 3.1*   CBG: Recent Labs  Lab 07/19/20 1238 07/19/20 1606 07/19/20 2225 07/20/20 0835 07/20/20 1105  GLUCAP 174* 244* 207* 84 213*   Urine analysis:    Component Value Date/Time   COLORURINE YELLOW 07/08/2020 0217   APPEARANCEUR CLEAR 07/08/2020 0217   LABSPEC 1.033 (H) 07/08/2020 0217   PHURINE 5.0 07/08/2020 0217   GLUCOSEU >=500 (A) 07/08/2020 0217   HGBUR NEGATIVE 07/08/2020 0217   BILIRUBINUR NEGATIVE 07/08/2020 0217   KETONESUR NEGATIVE 07/08/2020 0217   PROTEINUR NEGATIVE 07/08/2020 0217   NITRITE NEGATIVE 07/08/2020 0217   LEUKOCYTESUR NEGATIVE 07/08/2020 0217   Recent Results (from the past 240 hour(s))  Blood Culture (routine x 2)     Status: None   Collection Time: 07/10/20  3:14 PM   Specimen: BLOOD  Result Value Ref Range Status   Specimen Description   Final    BLOOD LEFT ANTECUBITAL Performed at [facility], [address] [place], [place]    Special Requests   Final    BOTTLES DRAWN AEROBIC AND  ANAEROBIC Blood Culture adequate volume Performed at [facility], [address] [place], [place]    Culture   Final    NO GROWTH 5 DAYS Performed at [facility], [address] [place], [place]    Report Status 07/15/2020 FINAL  Final  Blood Culture (routine x 2)     Status: None   Collection Time: 07/10/20  3:15 PM   Specimen: BLOOD LEFT HAND  Result Value Ref Range Status   Specimen Description   Final    BLOOD LEFT HAND Performed at [facility], [address] [place], [place]    Special Requests  Final    BOTTLES DRAWN AEROBIC AND ANAEROBIC Blood Culture results may not be optimal due to an excessive volume of blood received in culture bottles Performed at [facility], [address], [place], [place] [address]    Culture   Final    NO GROWTH 5 DAYS Performed at [facility], [address] [place], [place] [address]    Report Status 07/15/2020 FINAL  Final      Radiology Studies: No results found.  Scheduled Meds:  (feeding supplement) PROSource Plus  30 mL Oral BID BM   aspirin EC  81 mg Oral Daily   atorvastatin  80 mg Oral Daily   baricitinib  4 mg Oral Daily   empagliflozin  25 mg Oral Daily   enoxaparin (LOVENOX) injection  60 mg Subcutaneous QHS   feeding supplement (KATE FARMS STANDARD 1.4)  325 mL Oral Daily   insulin aspart  0-20 Units Subcutaneous TID WC   insulin aspart  0-5 Units Subcutaneous QHS   insulin aspart  10 Units Subcutaneous TID WC   insulin detemir  18 Units Subcutaneous BID   linagliptin  5 mg Oral Daily   methylPREDNISolone (SOLU-MEDROL) injection  40 mg Intravenous Daily   metoprolol succinate  100 mg Oral Daily   multivitamin with minerals  1 tablet Oral Daily   sodium chloride flush  3 mL Intravenous Q12H   Continuous Infusions:  sodium chloride       LOS: 10 days   Time spent: 35 minutes.  Tyrone Nine,  MD [facility] [email or web address] 07/20/2020, 2:16 PM

## 2020-07-20 NOTE — Progress Notes (Signed)
Pt medicated with Tylendol and given abt, states relief,pain improved , will cont to monitor. SRP, RN

## 2020-07-20 NOTE — Progress Notes (Signed)
   07/20/20 1100  Clinical Encounter Type  Visited With Patient  Visit Type Initial;Psychological support;Spiritual support  Referral From Patient  Consult/Referral To Chaplain  Spiritual Encounters  Spiritual Needs Emotional;Other (Comment) (Spiritual Care Conversation/Support)  Stress Factors  Patient Stress Factors None identified   I spoke briefly with Jake Mosley to provide spiritual care. He did not identify any needs at this time.   Please, contact Spiritual Care for further assistance.   Chaplain Clint Bolder M.Div., [facility]

## 2020-07-20 NOTE — Plan of Care (Signed)
  Problem: Education: Goal: Knowledge of General Education information will improve Description: Including pain rating scale, medication(s)/side effects and non-pharmacologic comfort measures Outcome: Progressing   Problem: Clinical Measurements: Goal: Diagnostic test results will improve Outcome: Progressing   

## 2020-07-21 LAB — GLUCOSE, CAPILLARY
Glucose-Capillary: 85 mg/dL (ref 70–99)
Glucose-Capillary: 120 mg/dL — ABNORMAL HIGH (ref 70–99)
Glucose-Capillary: 194 mg/dL — ABNORMAL HIGH (ref 70–99)
Glucose-Capillary: 276 mg/dL — ABNORMAL HIGH (ref 70–99)

## 2020-07-21 MED ORDER — PREDNISONE 20 MG PO TABS
20.0000 mg | ORAL_TABLET | Freq: Two times a day (BID) | ORAL | Status: DC
  Administered 2020-07-21 – 2020-07-22 (×3): 20 mg via ORAL
  Filled 2020-07-21 (×3): qty 1

## 2020-07-21 MED ORDER — PREDNISONE 10 MG PO TABS: 10.0000 mg | ORAL_TABLET | Freq: Every day | ORAL | Status: DC

## 2020-07-21 MED ORDER — PREDNISONE 10 MG PO TABS: 10.0000 mg | ORAL_TABLET | Freq: Two times a day (BID) | ORAL | Status: DC

## 2020-07-21 NOTE — Progress Notes (Signed)
Pt ambulated in the room about 10 lapse around the room, pt currently on 4 liters, at moderate pace; pt O2 sat decreased to 85% as the lowest point, did not sustain verified on another device O2 sat 86%, pt recovers quickly to 90% on 4 liters  then at rest on 4 liters 88-90%. Pt denies shortness of breath and able to talk while walking at moderate pace. SRP, RN

## 2020-07-21 NOTE — Progress Notes (Signed)
PROGRESS NOTE  Jake Mosley  ATF:[id number] DOB: 1961/03/17 DOA: 07/10/2020 PCP: Jake Mosley., MD   Brief Narrative: Jake Mosley is a 59 y.o. male with a history of T2DM, HTN, HLD, obesity who had been admitted 8/11-8/12 for covid-19 pneumonia, discharged with outpatient remdesivir infusions scheduled, and was found to be hypoxic at infusion center on 8/14. 2L O2 was given and patient sent to ED, subsequently admitted.  Assessment & Plan: Active Problems:   Pneumonia due to COVID-19 virus   Essential hypertension   Acute respiratory failure with hypoxia (HCC)   Type 2 diabetes mellitus without complication (HCC)  Acute hypoxemic respiratory failure due to covid-19 pneumonia: SARS-CoV-2 PCR positive on 8/11. - Continue supplemental oxygen. Pt is typical happy hypoxemic. Weaning efforts to continue. SpO2: 93 % O2 Flow Rate (L/min): 5 L/min FiO2 (%): 100 % - Completed remdesivir (8/11 - 8/15) - Will taper steroids now that it's been 12 days Tx, CRP normalized on 8/18.  - s/p lasix x2 days. No peripheral overload, LV function appeared normal on echo. - Stop baricitinib, started 8/16, due to possible developing dental abscess. - Encourage OOB, IS, FV, and awake proning if able. Again urged to prone as much as possible. - Continue airborne, contact precautions for 21 days from positive testing. - Enoxaparin prophylactic dose. D-dimer remains wnl.  Dental caries, odontogenic abscess, likely POA:  - Baricitinib stopped.  - Augmentin started 8/24 - Patient admits to having issues with dental caries over the past few months treating it without medical supervision using antibiotics from alternative source - Recommend close outpatient follow-up with PCP and dentist after discharge  T2DM: HbA1c 7.4%. Uncontrolled.  - Continue levemir 18u BID, novolog 10u TIDWC,  resistant SSI and continue HS coverage. Will monitor CBGs with tapering steroids. - Continue linagliptin.  - Dietitian  consulted. Carb-modified diet.  - Weaning steroids as above   Hyperlipidemia:  - Continue statin  HTN:  - Continue metoprolol  Obesity: Estimated body mass index is 41.35 kg/m as calculated from the following:   Height as of this encounter: 5\' 7"  (1.702 m).   Weight as of this encounter: 119.7 kg.  DVT prophylaxis: Lovenox 0.5mg /kg q24h. Code Status: Full Family Communication: Patient to call wife Disposition Plan:  Status is: Inpatient  Remains inpatient appropriate because:Inpatient level of care appropriate due to severity of illness  Dispo: The patient is from: Home              Anticipated d/c is to: Home              Anticipated d/c date is: 1 - 2 days, will remain inpatient as long as hypoxemia is so severe              Patient currently is not medically stable to d/c. Will require PT/OT evaluations (ordered) and decreased oxygen demand.   Consultants:   None  Procedures:   None  Antimicrobials:  Remdesivir 8/11 - 8/15  Augmentin 8/24 - ongoing  Subjective: No issues or events overnight, dental pain appears to be patient's most notable complaint but improving on antibiotics, denies nausea, vomiting, diarrhea, constipation, headache, fevers, chills.  Dyspnea with exertion minimally improving over the past week but not yet back to baseline.   Objective: Vitals:   07/20/20 0915 07/20/20 1111 07/20/20 2102 07/21/20 0559  BP:  (!) 144/113 115/81 108/68  Pulse:  75 63 (!) 54  Resp:  17 20 18   Temp:  98.2 F (36.8 C) 98.4 F (  36.9 C) 97.9 F (36.6 C)  TempSrc:  Oral Oral Oral  SpO2: 91% (!) 88% 93% 93%  Weight:      Height:       No intake or output data in the 24 hours ending 07/21/20 0742 Filed Weights   07/11/20 1948  Weight: 119.7 kg   Gen: 59 y.o. male in no distress HEENT: Edentulous with multiple broken teeth, gingivitis, no obvious exudate. Pulm: Clear to auscultate bilaterally, nonlabored, speaking in full sentences at rest, 3-4 word  sentences while ambulating. CV: Regular rate and rhythm. No murmur, rub, or gallop. No JVD, no dependent edema. GI: Abdomen soft, non-tender, non-distended, with normoactive bowel sounds.  Ext: Warm, no deformities Skin: No rashes, lesions or ulcers on visualized skin. Neuro: Alert and oriented. No focal neurological deficits. Psych: Judgement and insight appear fair. Mood euthymic & affect congruent. Behavior is appropriate.    Data Reviewed: I have personally reviewed following labs and imaging studies  CBC: No results for input(s): WBC, NEUTROABS, HGB, HCT, MCV, PLT in the last 168 hours. Basic Metabolic Panel: Recent Labs  Lab 07/15/20 0432 07/17/20 0445  NA 138 139  K 4.2 4.9  CL 103 101  CO2 26 29  GLUCOSE 177* 182*  BUN 39* 40*  CREATININE 0.97 1.04  CALCIUM 8.2* 8.4*  PHOS 5.1*  --    GFR: Estimated Creatinine Clearance: 95.9 mL/min (by C-G formula based on SCr of 1.04 mg/dL). Liver Function Tests: Recent Labs  Lab 07/15/20 0432  ALBUMIN 3.1*   CBG: Recent Labs  Lab 07/19/20 2225 07/20/20 0835 07/20/20 1105 07/20/20 1811 07/20/20 2059  GLUCAP 207* 84 213* 148* 173*   Urine analysis:    Component Value Date/Time   COLORURINE YELLOW 07/08/2020 0217   APPEARANCEUR CLEAR 07/08/2020 0217   LABSPEC 1.033 (H) 07/08/2020 0217   PHURINE 5.0 07/08/2020 0217   GLUCOSEU >=500 (A) 07/08/2020 0217   HGBUR NEGATIVE 07/08/2020 0217   BILIRUBINUR NEGATIVE 07/08/2020 0217   KETONESUR NEGATIVE 07/08/2020 0217   PROTEINUR NEGATIVE 07/08/2020 0217   NITRITE NEGATIVE 07/08/2020 0217   LEUKOCYTESUR NEGATIVE 07/08/2020 0217   No results found for this or any previous visit (from the past 240 hour(s)).    Radiology Studies: No results found.  Scheduled Meds:  (feeding supplement) PROSource Plus  30 mL Oral BID BM   amoxicillin-clavulanate  1 tablet Oral Q12H   aspirin EC  81 mg Oral Daily   atorvastatin  80 mg Oral Daily   empagliflozin  25 mg Oral Daily    enoxaparin (LOVENOX) injection  60 mg Subcutaneous QHS   feeding supplement (KATE FARMS STANDARD 1.4)  325 mL Oral Daily   insulin aspart  0-20 Units Subcutaneous TID WC   insulin aspart  0-5 Units Subcutaneous QHS   insulin aspart  10 Units Subcutaneous TID WC   insulin detemir  18 Units Subcutaneous BID   linagliptin  5 mg Oral Daily   metoprolol succinate  100 mg Oral Daily   multivitamin with minerals  1 tablet Oral Daily   sodium chloride flush  3 mL Intravenous Q12H   Continuous Infusions:  sodium chloride       LOS: 11 days   Time spent: 35 minutes.  Azucena Fallen, DO [facility] [email or web address] 07/21/2020, 7:42 AM

## 2020-07-21 NOTE — Progress Notes (Signed)
O2 sat 93% on 5 liters decreased to 4 liters, encouraged IS and flutter valve, will cont to monitor sat on 4 liters. SRP, RN

## 2020-07-21 NOTE — Plan of Care (Signed)
  Problem: Education: Goal: Knowledge of General Education information will improve Description: Including pain rating scale, medication(s)/side effects and non-pharmacologic comfort measures Outcome: Progressing   Problem: Health Behavior/Discharge Planning: Goal: Ability to manage health-related needs will improve Outcome: Progressing   Problem: Coping: Goal: Psychosocial and spiritual needs will be supported Outcome: Progressing   Problem: Education: Goal: Ability to describe self-care measures that may prevent or decrease complications (Diabetes Survival Skills Education) will improve Outcome: Completed/Met Goal: Individualized Educational Video(s) Outcome: Completed/Met

## 2020-07-21 NOTE — Plan of Care (Signed)
  Problem: Education: Goal: Knowledge of General Education information will improve Description Including pain rating scale, medication(s)/side effects and non-pharmacologic comfort measures Outcome: Progressing   Problem: Health Behavior/Discharge Planning: Goal: Ability to manage health-related needs will improve Outcome: Progressing   

## 2020-07-22 LAB — GLUCOSE, CAPILLARY
Glucose-Capillary: 117 mg/dL — ABNORMAL HIGH (ref 70–99)
Glucose-Capillary: 211 mg/dL — ABNORMAL HIGH (ref 70–99)

## 2020-07-22 MED ORDER — AMOXICILLIN-POT CLAVULANATE 875-125 MG PO TABS: 1.0000 | ORAL_TABLET | Freq: Two times a day (BID) | ORAL | 0 refills | Status: AC

## 2020-07-22 MED ORDER — PREDNISONE 20 MG PO TABS: 20.0000 mg | ORAL_TABLET | Freq: Two times a day (BID) | ORAL | 0 refills | Status: AC

## 2020-07-22 MED ORDER — INSULIN GLARGINE 100 UNIT/ML SOLOSTAR PEN: 20.0000 [IU] | PEN_INJECTOR | Freq: Two times a day (BID) | SUBCUTANEOUS | 0 refills | Status: AC

## 2020-07-22 MED ORDER — PREDNISONE 10 MG PO TABS: 10.0000 mg | ORAL_TABLET | Freq: Two times a day (BID) | ORAL | 0 refills | Status: AC

## 2020-07-22 MED ORDER — PREDNISONE 10 MG PO TABS: 10.0000 mg | ORAL_TABLET | Freq: Every day | ORAL | 0 refills | Status: AC

## 2020-07-22 NOTE — Discharge Instructions (Signed)
COVID-19 Frequently Asked Questions COVID-19 (coronavirus disease) is an infection that is caused by a large family of viruses. Some viruses cause illness in people and others cause illness in animals like camels, cats, and bats. In some cases, the viruses that cause illness in animals can spread to humans. Where did the coronavirus come from? In December 2019, [place] told the [facility]) of several cases of lung disease (human respiratory illness). These cases were linked to an open seafood and livestock market in the city of [place]. The link to the seafood and livestock market suggests that the virus may have spread from animals to humans. However, since that first outbreak in December, the virus has also been shown to spread from person to person. What is the name of the disease and the virus? Disease name Early on, this disease was called novel coronavirus. This is because scientists determined that the disease was caused by a new (novel) respiratory virus. The World Health Organization [facility]) has now named the disease COVID-19, or coronavirus disease. Virus name The virus that causes the disease is called severe acute respiratory syndrome coronavirus 2 (SARS-CoV-2). More information on disease and virus naming World Health Organization [facility]): www.who.int/emergencies/diseases/novel-coronavirus-2019/technical-guidance/naming-the-coronavirus-disease-(covid-2019)-and-the-virus-that-causes-it Who is at risk for complications from coronavirus disease? Some people may be at higher risk for complications from coronavirus disease. This includes older adults and people who have chronic diseases, such as heart disease, diabetes, and lung disease. If you are at higher risk for complications, take these extra precautions:  Stay home as much as possible.  Avoid social gatherings and travel.  Avoid close contact with others. Stay at least 6 ft (2 m) away from others, if  possible.  Wash your hands often with soap and water for at least 20 seconds.  Avoid touching your face, mouth, nose, or eyes.  Keep supplies on hand at home, such as food, medicine, and cleaning supplies.  If you must go out in public, wear a cloth face covering or face mask. Make sure your mask covers your nose and mouth. How does coronavirus disease spread? The virus that causes coronavirus disease spreads easily from person to person (is contagious). You may catch the virus by:  Breathing in droplets from an infected person. Droplets can be spread by a person breathing, speaking, singing, coughing, or sneezing.  Touching something, like a table or a doorknob, that was exposed to the virus (contaminated) and then touching your mouth, nose, or eyes. Can I get the virus from touching surfaces or objects? There is still a lot that we do not know about the virus that causes coronavirus disease. Scientists are basing a lot of information on what they know about similar viruses, such as:  Viruses cannot generally survive on surfaces for long. They need a human body (host) to survive.  It is more likely that the virus is spread by close contact with people who are sick (direct contact), such as through: ? Shaking hands or hugging. ? Breathing in respiratory droplets that travel through the air. Droplets can be spread by a person breathing, speaking, singing, coughing, or sneezing.  It is less likely that the virus is spread when a person touches a surface or object that has the virus on it (indirect contact). The virus may be able to enter the body if the person touches a surface or object and then touches his or her face, eyes, nose, or mouth. Can a person spread the virus without having symptoms of the  disease? It may be possible for the virus to spread before a person has symptoms of the disease, but this is most likely not the main way the virus is spreading. It is more likely for the virus  to spread by being in close contact with people who are sick and breathing in the respiratory droplets spread by a person breathing, speaking, singing, coughing, or sneezing. What are the symptoms of coronavirus disease? Symptoms vary from person to person and can range from mild to severe. Symptoms may include:  Fever or chills.  Cough.  Difficulty breathing or feeling short of breath.  Headaches, body aches, or muscle aches.  Runny or stuffy (congested) nose.  Sore throat.  New loss of taste or smell.  Nausea, vomiting, or diarrhea. These symptoms can appear anywhere from 2 to 14 days after you have been exposed to the virus. Some people may not have any symptoms. If you develop symptoms, call your health care provider. People with severe symptoms may need hospital care. Should I be tested for this virus? Your health care provider will decide whether to test you based on your symptoms, history of exposure, and your risk factors. How does a health care provider test for this virus? Health care providers will collect samples to send for testing. Samples may include:  Taking a swab of fluid from the back of your nose and throat, your nose, or your throat.  Taking fluid from the lungs by having you cough up mucus (sputum) into a sterile cup.  Taking a blood sample. Is there a treatment or vaccine for this virus? Currently, there is no vaccine to prevent coronavirus disease. Also, there are no medicines like antibiotics or antivirals to treat the virus. A person who becomes sick is given supportive care, which means rest and fluids. A person may also relieve his or her symptoms by using over-the-counter medicines that treat sneezing, coughing, and runny nose. These are the same medicines that a person takes for the common cold. If you develop symptoms, call your health care provider. People with severe symptoms may need hospital care. What can I do to protect myself and my family from  this virus?     You can protect yourself and your family by taking the same actions that you would take to prevent the spread of other viruses. Take the following actions:  Wash your hands often with soap and water for at least 20 seconds. If soap and water are not available, use alcohol-based hand sanitizer.  Avoid touching your face, mouth, nose, or eyes.  Cough or sneeze into a tissue, sleeve, or elbow. Do not cough or sneeze into your hand or the air. ? If you cough or sneeze into a tissue, throw it away immediately and wash your hands.  Disinfect objects and surfaces that you frequently touch every day.  Stay away from people who are sick.  Avoid going out in public, follow guidance from your state and local health authorities.  Avoid crowded indoor spaces. Stay at least 6 ft (2 m) away from others.  If you must go out in public, wear a cloth face covering or face mask. Make sure your mask covers your nose and mouth.  Stay home if you are sick, except to get medical care. Call your health care provider before you get medical care. Your health care provider will tell you how long to stay home.  Make sure your vaccines are up to date. Ask your health care provider  what vaccines you need. What should I do if I need to travel? Follow travel recommendations from your local health authority, the CDC, and WHO. Travel information and advice  Centers for Disease Control and Prevention (CDC): BodyEditor.hu  World Health Organization [facility]): ThirdIncome.ca Know the risks and take action to protect your health  You are at higher risk of getting coronavirus disease if you are traveling to areas with an outbreak or if you are exposed to travelers from areas with an outbreak.  Wash your hands often and practice good hygiene to lower the risk of catching or spreading the virus. What should I do  if I am sick? General instructions to stop the spread of infection  Wash your hands often with soap and water for at least 20 seconds. If soap and water are not available, use alcohol-based hand sanitizer.  Cough or sneeze into a tissue, sleeve, or elbow. Do not cough or sneeze into your hand or the air.  If you cough or sneeze into a tissue, throw it away immediately and wash your hands.  Stay home unless you must get medical care. Call your health care provider or local health authority before you get medical care.  Avoid public areas. Do not take public transportation, if possible.  If you can, wear a mask if you must go out of the house or if you are in close contact with someone who is not sick. Make sure your mask covers your nose and mouth. Keep your home clean  Disinfect objects and surfaces that are frequently touched every day. This may include: ? Counters and tables. ? Doorknobs and light switches. ? Sinks and faucets. ? Electronics such as phones, remote controls, keyboards, computers, and tablets.  Wash dishes in hot, soapy water or use a dishwasher. Air-dry your dishes.  Wash laundry in hot water. Prevent infecting other household members  Let healthy household members care for children and pets, if possible. If you have to care for children or pets, wash your hands often and wear a mask.  Sleep in a different bedroom or bed, if possible.  Do not share personal items, such as razors, toothbrushes, deodorant, combs, brushes, towels, and washcloths. Where to find more information Centers for Disease Control and Prevention (CDC)  Information and news updates: https://www.butler-gonzalez.com/ World Health Organization [facility])  Information and news updates: MissExecutive.com.ee  Coronavirus health topic: https://www.castaneda.info/  Questions and answers on COVID-19:  OpportunityDebt.at  Global tracker: who.sprinklr.com American Academy of Pediatrics (AAP)  Information for families: www.healthychildren.org/English/health-issues/conditions/chest-lungs/Pages/2019-Novel-Coronavirus.aspx The coronavirus situation is changing rapidly. Check your local health authority website or the CDC and [facility] websites for updates and news. When should I contact a health care provider?  Contact your health care provider if you have symptoms of an infection, such as fever or cough, and you: ? Have been near anyone who is known to have coronavirus disease. ? Have come into contact with a person who is suspected to have coronavirus disease. ? Have traveled to an area where there is an outbreak of COVID-19. When should I get emergency medical care?  Get help right away by calling your local emergency services (911 in the [place] if you have: ? Trouble breathing. ? Pain or pressure in your chest. ? Confusion. ? Blue-tinged lips and fingernails. ? Difficulty waking from sleep. ? Symptoms that get worse. Let the emergency medical personnel know if you think you have coronavirus disease. Summary  A new respiratory virus is spreading from person to person and  causing COVID-19 (coronavirus disease).  The virus that causes COVID-19 appears to spread easily. It spreads from one person to another through droplets from breathing, speaking, singing, coughing, or sneezing.  Older adults and those with chronic diseases are at higher risk of disease. If you are at higher risk for complications, take extra precautions.  There is currently no vaccine to prevent coronavirus disease. There are no medicines, such as antibiotics or antivirals, to treat the virus.  You can protect yourself and your family by washing your hands often, avoiding touching your face, and covering your coughs and sneezes. This information is not intended to replace advice given to you  by your health care provider. Make sure you discuss any questions you have with your health care provider. Document Revised: 09/12/2019 Document Reviewed: 03/11/2019 Elsevier Patient Education  2020 Elsevier Inc.  COVID-19: How to Protect Yourself and Others Know how it spreads  There is currently no vaccine to prevent coronavirus disease 2019 (COVID-19).  The best way to prevent illness is to avoid being exposed to this virus.  The virus is thought to spread mainly from person-to-person. ? Between people who are in close contact with one another (within about 6 feet). ? Through respiratory droplets produced when an infected person coughs, sneezes or talks. ? These droplets can land in the mouths or noses of people who are nearby or possibly be inhaled into the lungs. ? COVID-19 may be spread by people who are not showing symptoms. Everyone should Clean your hands often  Wash your hands often with soap and water for at least 20 seconds especially after you have been in a public place, or after blowing your nose, coughing, or sneezing.  If soap and water are not readily available, use a hand sanitizer that contains at least 60% alcohol. Cover all surfaces of your hands and rub them together until they feel dry.  Avoid touching your eyes, nose, and mouth with unwashed hands. Avoid close contact  Limit contact with others as much as possible.  Avoid close contact with people who are sick.  Put distance between yourself and other people. ? Remember that some people without symptoms may be able to spread virus. ? This is especially important for people who are at higher risk of getting very RetroStamps.it Cover your mouth and nose with a mask when around others  You could spread COVID-19 to others even if you do not feel sick.  Everyone should wear a mask in public settings and when around people not living in  their household, especially when social distancing is difficult to maintain. ? Masks should not be placed on young children under age 81, anyone who has trouble breathing, or is unconscious, incapacitated or otherwise unable to remove the mask without assistance.  The mask is meant to protect other people in case you are infected.  Do NOT use a facemask meant for a Research scientist (physical sciences).  Continue to keep about 6 feet between yourself and others. The mask is not a substitute for social distancing. Cover coughs and sneezes  Always cover your mouth and nose with a tissue when you cough or sneeze or use the inside of your elbow.  Throw used tissues in the trash.  Immediately wash your hands with soap and water for at least 20 seconds. If soap and water are not readily available, clean your hands with a hand sanitizer that contains at least 60% alcohol. Clean and disinfect  Clean AND disinfect frequently touched surfaces daily.  This includes tables, doorknobs, light switches, countertops, handles, desks, phones, keyboards, toilets, faucets, and sinks. ktimeonline.com  If surfaces are dirty, clean them: Use detergent or soap and water prior to disinfection.  Then, use a household disinfectant. You can see a list of EPA-registered household disinfectants here. SouthAmericaFlowers.co.uk 07/30/2019 This information is not intended to replace advice given to you by your health care provider. Make sure you discuss any questions you have with your health care provider. Document Revised: 08/07/2019 Document Reviewed: 06/05/2019 Elsevier Patient Education  2020 Elsevier Inc.   COVID-19 COVID-19 is a respiratory infection that is caused by a virus called severe acute respiratory syndrome coronavirus 2 (SARS-CoV-2). The disease is also known as coronavirus disease or novel coronavirus. In some people, the virus may not cause any symptoms. In  others, it may cause a serious infection. The infection can get worse quickly and can lead to complications, such as:  Pneumonia, or infection of the lungs.  Acute respiratory distress syndrome or ARDS. This is a condition in which fluid build-up in the lungs prevents the lungs from filling with air and passing oxygen into the blood.  Acute respiratory failure. This is a condition in which there is not enough oxygen passing from the lungs to the body or when carbon dioxide is not passing from the lungs out of the body.  Sepsis or septic shock. This is a serious bodily reaction to an infection.  Blood clotting problems.  Secondary infections due to bacteria or fungus.  Organ failure. This is when your body's organs stop working. The virus that causes COVID-19 is contagious. This means that it can spread from person to person through droplets from coughs and sneezes (respiratory secretions). What are the causes? This illness is caused by a virus. You may catch the virus by:  Breathing in droplets from an infected person. Droplets can be spread by a person breathing, speaking, singing, coughing, or sneezing.  Touching something, like a table or a doorknob, that was exposed to the virus (contaminated) and then touching your mouth, nose, or eyes. What increases the risk? Risk for infection You are more likely to be infected with this virus if you:  Are within 6 feet (2 meters) of a person with COVID-19.  Provide care for or live with a person who is infected with COVID-19.  Spend time in crowded indoor spaces or live in shared housing. Risk for serious illness You are more likely to become seriously ill from the virus if you:  Are 5 years of age or older. The higher your age, the more you are at risk for serious illness.  Live in a nursing home or long-term care facility.  Have cancer.  Have a long-term (chronic) disease such as: ? Chronic lung disease, including chronic  obstructive pulmonary disease or asthma. ? A long-term disease that lowers your body's ability to fight infection (immunocompromised). ? Heart disease, including heart failure, a condition in which the arteries that lead to the heart become narrow or blocked (coronary artery disease), a disease which makes the heart muscle thick, weak, or stiff (cardiomyopathy). ? Diabetes. ? Chronic kidney disease. ? Sickle cell disease, a condition in which red blood cells have an abnormal "sickle" shape. ? Liver disease.  Are obese. What are the signs or symptoms? Symptoms of this condition can range from mild to severe. Symptoms may appear any time from 2 to 14 days after being exposed to the virus. They include:  A fever or chills.  A cough.  Difficulty breathing.  Headaches, body aches, or muscle aches.  Runny or stuffy (congested) nose.  A sore throat.  New loss of taste or smell. Some people may also have stomach problems, such as nausea, vomiting, or diarrhea. Other people may not have any symptoms of COVID-19. How is this diagnosed? This condition may be diagnosed based on:  Your signs and symptoms, especially if: ? You live in an area with a COVID-19 outbreak. ? You recently traveled to or from an area where the virus is common. ? You provide care for or live with a person who was diagnosed with COVID-19. ? You were exposed to a person who was diagnosed with COVID-19.  A physical exam.  Lab tests, which may include: ? Taking a sample of fluid from the back of your nose and throat (nasopharyngeal fluid), your nose, or your throat using a swab. ? A sample of mucus from your lungs (sputum). ? Blood tests.  Imaging tests, which may include, X-rays, CT scan, or ultrasound. How is this treated? At present, there is no medicine to treat COVID-19. Medicines that treat other diseases are being used on a trial basis to see if they are effective against COVID-19. Your health care  provider will talk with you about ways to treat your symptoms. For most people, the infection is mild and can be managed at home with rest, fluids, and over-the-counter medicines. Treatment for a serious infection usually takes places in a hospital intensive care unit (ICU). It may include one or more of the following treatments. These treatments are given until your symptoms improve.  Receiving fluids and medicines through an IV.  Supplemental oxygen. Extra oxygen is given through a tube in the nose, a face mask, or a hood.  Positioning you to lie on your stomach (prone position). This makes it easier for oxygen to get into the lungs.  Continuous positive airway pressure (CPAP) or bi-level positive airway pressure (BPAP) machine. This treatment uses mild air pressure to keep the airways open. A tube that is connected to a motor delivers oxygen to the body.  Ventilator. This treatment moves air into and out of the lungs by using a tube that is placed in your windpipe.  Tracheostomy. This is a procedure to create a hole in the neck so that a breathing tube can be inserted.  Extracorporeal membrane oxygenation (ECMO). This procedure gives the lungs a chance to recover by taking over the functions of the heart and lungs. It supplies oxygen to the body and removes carbon dioxide. Follow these instructions at home: Lifestyle  If you are sick, stay home except to get medical care. Your health care provider will tell you how long to stay home. Call your health care provider before you go for medical care.  Rest at home as told by your health care provider.  Do not use any products that contain nicotine or tobacco, such as cigarettes, e-cigarettes, and chewing tobacco. If you need help quitting, ask your health care provider.  Return to your normal activities as told by your health care provider. Ask your health care provider what activities are safe for you. General instructions  Take  over-the-counter and prescription medicines only as told by your health care provider.  Drink enough fluid to keep your urine pale yellow.  Keep all follow-up visits as told by your health care provider. This is important. How is this prevented?  There is no vaccine to help prevent COVID-19 infection. However,  there are steps you can take to protect yourself and others from this virus. To protect yourself:   Do not travel to areas where COVID-19 is a risk. The areas where COVID-19 is reported change often. To identify high-risk areas and travel restrictions, check the CDC travel website: StageSync.si  If you live in, or must travel to, an area where COVID-19 is a risk, take precautions to avoid infection. ? Stay away from people who are sick. ? Wash your hands often with soap and water for 20 seconds. If soap and water are not available, use an alcohol-based hand sanitizer. ? Avoid touching your mouth, face, eyes, or nose. ? Avoid going out in public, follow guidance from your state and local health authorities. ? If you must go out in public, wear a cloth face covering or face mask. Make sure your mask covers your nose and mouth. ? Avoid crowded indoor spaces. Stay at least 6 feet (2 meters) away from others. ? Disinfect objects and surfaces that are frequently touched every day. This may include:  Counters and tables.  Doorknobs and light switches.  Sinks and faucets.  Electronics, such as phones, remote controls, keyboards, computers, and tablets. To protect others: If you have symptoms of COVID-19, take steps to prevent the virus from spreading to others.  If you think you have a COVID-19 infection, contact your health care provider right away. Tell your health care team that you think you may have a COVID-19 infection.  Stay home. Leave your house only to seek medical care. Do not use public transport.  Do not travel while you are sick.  Wash your hands often  with soap and water for 20 seconds. If soap and water are not available, use alcohol-based hand sanitizer.  Stay away from other members of your household. Let healthy household members care for children and pets, if possible. If you have to care for children or pets, wash your hands often and wear a mask. If possible, stay in your own room, separate from others. Use a different bathroom.  Make sure that all people in your household wash their hands well and often.  Cough or sneeze into a tissue or your sleeve or elbow. Do not cough or sneeze into your hand or into the air.  Wear a cloth face covering or face mask. Make sure your mask covers your nose and mouth. Where to find more information  Centers for Disease Control and Prevention: StickerEmporium.tn  World Health Organization: https://thompson-craig.com/ Contact a health care provider if:  You live in or have traveled to an area where COVID-19 is a risk and you have symptoms of the infection.  You have had contact with someone who has COVID-19 and you have symptoms of the infection. Get help right away if:  You have trouble breathing.  You have pain or pressure in your chest.  You have confusion.  You have bluish lips and fingernails.  You have difficulty waking from sleep.  You have symptoms that get worse. These symptoms may represent a serious problem that is an emergency. Do not wait to see if the symptoms will go away. Get medical help right away. Call your local emergency services (911 in the [place] Do not drive yourself to the hospital. Let the emergency medical personnel know if you think you have COVID-19. Summary  COVID-19 is a respiratory infection that is caused by a virus. It is also known as coronavirus disease or novel coronavirus. It can cause serious infections,  such as pneumonia, acute respiratory distress syndrome, acute respiratory failure, or sepsis.  The virus that  causes COVID-19 is contagious. This means that it can spread from person to person through droplets from breathing, speaking, singing, coughing, or sneezing.  You are more likely to develop a serious illness if you are 16 years of age or older, have a weak immune system, live in a nursing home, or have chronic disease.  There is no medicine to treat COVID-19. Your health care provider will talk with you about ways to treat your symptoms.  Take steps to protect yourself and others from infection. Wash your hands often and disinfect objects and surfaces that are frequently touched every day. Stay away from people who are sick and wear a mask if you are sick. This information is not intended to replace advice given to you by your health care provider. Make sure you discuss any questions you have with your health care provider. Document Revised: 09/12/2019 Document Reviewed: 12/19/2018 Elsevier Patient Education  2020 [employer]   Hypoxemia  Hypoxemia occurs when the blood does not contain enough oxygen. The body cannot work well when it does not have enough oxygen because every part of the body needs oxygen. Oxygen enters the lungs when we breathe in, then it travels to all parts of the body through the blood. Hypoxemia can develop suddenly or slowly. What are the causes? Common causes of this condition include:  Long-term (chronic) lung diseases, such as chronic obstructive pulmonary disease (COPD) or interstitial lung disease.  Disorders that affect breathing at night, such as sleep apnea.  Fluid buildup in the lungs (pulmonary edema).  Lung infection (pneumonia).  Lung or throat cancer.  Abnormal blood flow that bypasses the lungs (having a shunt).  Certain diseases that affect nerves or muscles.  A collapsed lung (pneumothorax).  A blood clot in the lungs (pulmonary embolus).  Certain types of heart disease.  Slow or shallow breathing (hypoventilation).  Certain  medicines.  High altitudes.  Toxic chemicals, smoke, and gases. What are the signs or symptoms? In some cases, there may be no symptoms of this condition. If you do have symptoms, they may include:  Shortness of breath (dyspnea).  Bluish color of the skin, lips, or nail beds.  Breathing that is fast, noisy, or shallow.  A fast heartbeat.  Feeling tired or sleepy.  Feeling confused or worried. If hypoxemia develops quickly, you will likely have dyspnea. If hypoxemia develops slowly over months or years, you may not notice any symptoms. How is this diagnosed? This condition is diagnosed by:  A physical exam.  Blood tests.  A test that measures the percentage of oxygen in your blood (pulse oximetry). This is done with a sensor that is placed on your finger, toe, or earlobe. How is this treated? Treatment for this condition depends on the underlying cause of your hypoxemia. You will likely be treated with oxygen therapy to restore your blood oxygen level. Depending on the cause of your hypoxemia, you may need oxygen therapy for a short time (weeks or months), or you may need it for the rest of your life. Your health care provider may also recommend other therapies to treat the underlying cause of your hypoxemia. Follow these instructions at home:   Take over-the-counter and prescription medicines only as told by your health care provider.  If you are on oxygen therapy, follow oxygen safety precautions as directed by your health care provider. These may include: ? Always having  a backup supply of oxygen. ? Not allowing anyone to smoke or have a fire around oxygen. ? Handling oxygen tanks carefully and as instructed.  Do not use any products that contain nicotine or tobacco, such as cigarettes and e-cigarettes. If you need help quitting, ask your health care provider. Stay away from people who smoke.  Keep all follow-up visits as told by your health care provider. This is  important. Contact a health care provider if:  You have any concerns about your oxygen therapy.  You have trouble breathing, even during or after treatment.  You become short of breath when you exercise.  You are tired when you wake up.  You have a headache when you wake up. Get help right away if:  Your shortness of breath gets worse, especially with normal or minimal activity.  You have a bluish color of the skin, lips, or nail beds.  You become confused or you cannot think properly.  You cough up dark mucus or blood.  You have chest pain.  You have a fever. Summary  Hypoxemia occurs when the blood does not contain enough oxygen.  Hypoxemia may or may not cause symptoms. Often, the main symptom is shortness of breath (dyspnea).  Depending on the cause of your hypoxemia, you may need oxygen therapy for a short time (weeks or months), or you may need it for the rest of your life.  If you are on oxygen therapy, follow oxygen safety precautions as directed by your health care provider. This information is not intended to replace advice given to you by your health care provider. Make sure you discuss any questions you have with your health care provider. Document Revised: 09/03/2018 Document Reviewed: 10/17/2016 Elsevier Patient Education  2020 [employer]   Hypoxia Hypoxia is a condition that happens when there is a lack of oxygen in the body's tissues and organs. When there is not enough oxygen, organs cannot work as they should. This causes serious problems throughout the body and in the brain. What are the causes? This condition may be caused by:  Exposure to high altitude.  A collapsed lung (pneumothorax).  Lung infection (pneumonia).  Lung injury.  Long-term (chronic) lung disease, such as COPD (chronic obstructive pulmonary disease).  Blood collecting in the chest cavity (hemothorax).  Food, saliva, or vomit getting into the airway  (aspiration).  Reduced blood flow (ischemia).  Severe blood loss.  Slow or shallow breathing (hypoventilation).  Blood disorders, such as anemia.  Carbon monoxide poisoning.  The heart suddenly stopping (cardiac arrest).  Anesthetic medicines.  Drowning.  Choking. What are the signs or symptoms? Symptoms of this condition include:  Headache.  Fatigue.  Drowsiness.  Forgetfulness.  Nausea.  Confusion.  Shortness of breath.  Dizziness.  Bluish color of the skin, lips, or nail beds (cyanosis).  Change in consciousness or awareness. If hypoxia is not treated, it can lead to convulsions, loss of consciousness (coma), or brain damage. How is this diagnosed? This condition may be diagnosed based on:  A physical exam.  Blood tests.  A test that measures how much oxygen is in your blood (pulse oximetry). This is done with a sensor that is placed on your finger, toe, or earlobe.  Chest X-ray.  Tests to check your lung function (pulmonary function tests).  A test to check the electrical activity of your heart (electrocardiogram, ECG). You may have other tests to determine the cause of your hypoxia. How is this treated?  Treatment for this  condition depends on what is causing the hypoxia. You will likely be treated with oxygen therapy. This may be done by giving you oxygen through a face mask or through tubes in your nose. Your health care provider may also recommend other therapies to treat the underlying cause of your hypoxia. Follow these instructions at home:  Take over-the-counter and prescription medicines only as told by your health care provider.  Do not use any products that contain nicotine or tobacco, such as cigarettes and e-cigarettes. If you need help quitting, ask your health care provider.  Avoid secondhand smoke.  Work with your health care provider to manage any chronic conditions you have that may be causing hypoxia, such as COPD.  Keep  all follow-up visits as told by your health care provider. This is important. Contact a health care provider if:  You have a fever.  You have trouble breathing, even after treatment.  You become extremely short of breath when you exercise. Get help right away if:  Your shortness of breath gets worse, especially with normal or very little activity.  Your skin, lips, or nail beds have a bluish color.  You become confused or you cannot think properly.  You have chest pain. Summary  Hypoxia is a condition that happens when there is a lack of oxygen in the body's tissues and organs.  If hypoxia is not treated, it can lead to convulsions, loss of consciousness (coma), or brain damage.  Symptoms of hypoxia can include a headache, shortness of breath, confusion, nausea, and a bluish skin color.  Hypoxia has many possible causes, including exposure to high altitude, carbon monoxide poisoning, or other health issues, such as blood disorders or cardiac arrest.  Hypoxia is usually treated with oxygen therapy. This information is not intended to replace advice given to you by your health care provider. Make sure you discuss any questions you have with your health care provider. Document Revised: 10/26/2017 Document Reviewed: 01/01/2017 Elsevier Patient Education  2020 [employer]   Hypoxia Hypoxia is a condition that happens when there is a lack of oxygen in the body's tissues and organs. When there is not enough oxygen, organs cannot work as they should. This causes serious problems throughout the body and in the brain. What are the causes? This condition may be caused by:  Exposure to high altitude.  A collapsed lung (pneumothorax).  Lung infection (pneumonia).  Lung injury.  Long-term (chronic) lung disease, such as COPD (chronic obstructive pulmonary disease).  Blood collecting in the chest cavity (hemothorax).  Food, saliva, or vomit getting into the airway  (aspiration).  Reduced blood flow (ischemia).  Severe blood loss.  Slow or shallow breathing (hypoventilation).  Blood disorders, such as anemia.  Carbon monoxide poisoning.  The heart suddenly stopping (cardiac arrest).  Anesthetic medicines.  Drowning.  Choking. What are the signs or symptoms? Symptoms of this condition include:  Headache.  Fatigue.  Drowsiness.  Forgetfulness.  Nausea.  Confusion.  Shortness of breath.  Dizziness.  Bluish color of the skin, lips, or nail beds (cyanosis).  Change in consciousness or awareness. If hypoxia is not treated, it can lead to convulsions, loss of consciousness (coma), or brain damage. How is this diagnosed? This condition may be diagnosed based on:  A physical exam.  Blood tests.  A test that measures how much oxygen is in your blood (pulse oximetry). This is done with a sensor that is placed on your finger, toe, or earlobe.  Chest X-ray.  Tests  to check your lung function (pulmonary function tests).  A test to check the electrical activity of your heart (electrocardiogram, ECG). You may have other tests to determine the cause of your hypoxia. How is this treated?  Treatment for this condition depends on what is causing the hypoxia. You will likely be treated with oxygen therapy. This may be done by giving you oxygen through a face mask or through tubes in your nose. Your health care provider may also recommend other therapies to treat the underlying cause of your hypoxia. Follow these instructions at home:  Take over-the-counter and prescription medicines only as told by your health care provider.  Do not use any products that contain nicotine or tobacco, such as cigarettes and e-cigarettes. If you need help quitting, ask your health care provider.  Avoid secondhand smoke.  Work with your health care provider to manage any chronic conditions you have that may be causing hypoxia, such as COPD.  Keep  all follow-up visits as told by your health care provider. This is important. Contact a health care provider if:  You have a fever.  You have trouble breathing, even after treatment.  You become extremely short of breath when you exercise. Get help right away if:  Your shortness of breath gets worse, especially with normal or very little activity.  Your skin, lips, or nail beds have a bluish color.  You become confused or you cannot think properly.  You have chest pain. Summary  Hypoxia is a condition that happens when there is a lack of oxygen in the body's tissues and organs.  If hypoxia is not treated, it can lead to convulsions, loss of consciousness (coma), or brain damage.  Symptoms of hypoxia can include a headache, shortness of breath, confusion, nausea, and a bluish skin color.  Hypoxia has many possible causes, including exposure to high altitude, carbon monoxide poisoning, or other health issues, such as blood disorders or cardiac arrest.  Hypoxia is usually treated with oxygen therapy. This information is not intended to replace advice given to you by your health care provider. Make sure you discuss any questions you have with your health care provider. Document Revised: 10/26/2017 Document Reviewed: 01/01/2017 Elsevier Patient Education  2020 [employer] Inc.   Hypoxia Hypoxia is a condition that happens when there is a lack of oxygen in the body's tissues and organs. When there is not enough oxygen, organs cannot work as they should. This causes serious problems throughout the body and in the brain. What are the causes? This condition may be caused by:  Exposure to high altitude.  A collapsed lung (pneumothorax).  Lung infection (pneumonia).  Lung injury.  Long-term (chronic) lung disease, such as COPD (chronic obstructive pulmonary disease).  Blood collecting in the chest cavity (hemothorax).  Food, saliva, or vomit getting into the airway  (aspiration).  Reduced blood flow (ischemia).  Severe blood loss.  Slow or shallow breathing (hypoventilation).  Blood disorders, such as anemia.  Carbon monoxide poisoning.  The heart suddenly stopping (cardiac arrest).  Anesthetic medicines.  Drowning.  Choking. What are the signs or symptoms? Symptoms of this condition include:  Headache.  Fatigue.  Drowsiness.  Forgetfulness.  Nausea.  Confusion.  Shortness of breath.  Dizziness.  Bluish color of the skin, lips, or nail beds (cyanosis).  Change in consciousness or awareness. If hypoxia is not treated, it can lead to convulsions, loss of consciousness (coma), or brain damage. How is this diagnosed? This condition may be diagnosed based on:  A physical exam.  Blood tests.  A test that measures how much oxygen is in your blood (pulse oximetry). This is done with a sensor that is placed on your finger, toe, or earlobe.  Chest X-ray.  Tests to check your lung function (pulmonary function tests).  A test to check the electrical activity of your heart (electrocardiogram, ECG). You may have other tests to determine the cause of your hypoxia. How is this treated?  Treatment for this condition depends on what is causing the hypoxia. You will likely be treated with oxygen therapy. This may be done by giving you oxygen through a face mask or through tubes in your nose. Your health care provider may also recommend other therapies to treat the underlying cause of your hypoxia. Follow these instructions at home:  Take over-the-counter and prescription medicines only as told by your health care provider.  Do not use any products that contain nicotine or tobacco, such as cigarettes and e-cigarettes. If you need help quitting, ask your health care provider.  Avoid secondhand smoke.  Work with your health care provider to manage any chronic conditions you have that may be causing hypoxia, such as COPD.  Keep  all follow-up visits as told by your health care provider. This is important. Contact a health care provider if:  You have a fever.  You have trouble breathing, even after treatment.  You become extremely short of breath when you exercise. Get help right away if:  Your shortness of breath gets worse, especially with normal or very little activity.  Your skin, lips, or nail beds have a bluish color.  You become confused or you cannot think properly.  You have chest pain. Summary  Hypoxia is a condition that happens when there is a lack of oxygen in the body's tissues and organs.  If hypoxia is not treated, it can lead to convulsions, loss of consciousness (coma), or brain damage.  Symptoms of hypoxia can include a headache, shortness of breath, confusion, nausea, and a bluish skin color.  Hypoxia has many possible causes, including exposure to high altitude, carbon monoxide poisoning, or other health issues, such as blood disorders or cardiac arrest.  Hypoxia is usually treated with oxygen therapy. This information is not intended to replace advice given to you by your health care provider. Make sure you discuss any questions you have with your health care provider. Document Revised: 10/26/2017 Document Reviewed: 01/01/2017 Elsevier Patient Education  2020 [employer].  COVID-19: Quarantine vs. Isolation QUARANTINE keeps someone who was in close contact with someone who has COVID-19 away from others. If you had close contact with a person who has COVID-19  Stay home until 14 days after your last contact.  Check your temperature twice a day and watch for symptoms of COVID-19.  If possible, stay away from people who are at higher-risk for getting very sick from COVID-19. ISOLATION keeps someone who is sick or tested positive for COVID-19 without symptoms away from others, even in their own home. If you are sick and think or know you have COVID-19  Stay home until  after ? At least 10 days since symptoms first appeared and ? At least 24 hours with no fever without fever-reducing medication and ? Symptoms have improved If you tested positive for COVID-19 but do not have symptoms  Stay home until after ? 10 days have passed since your positive test If you live with others, stay in a specific "sick room" or area and away  from other people or animals, including pets. Use a separate bathroom, if available. [email or web address] 06/16/2019 This information is not intended to replace advice given to you by your health care provider. Make sure you discuss any questions you have with your health care provider. Document Revised: 10/30/2019 Document Reviewed: 10/30/2019 Elsevier Patient Education  2020 [employer]  COVID-19: What Your Test Results Mean If you test positive for COVID-19 Take steps to help prevent the spread of COVID-19 Stay home.  Do not leave your home, except to get medical care. Do not visit public areas. Get rest and stay hydrated. Take over-the-counter medicines, such as acetaminophen, to help you feel better. Stay in touch with your doctor. Separate yourself from other people.  As much as possible, stay in a specific room and away from other people and pets in your home. If you test negative for COVID-19  You probably were not infected at the time your sample was collected.  However, that does not mean you will not get sick.  It is possible that you were very early in your infection when your sample was collected and that you could test positive later. A negative test result does not mean you won't get sick later. [email or web address] 04/26/2019 This information is not intended to replace advice given to you by your health care provider. Make sure you discuss any questions you have with your health care provider. Document Revised: 10/30/2019 Document Reviewed: 10/30/2019 Elsevier Patient Education  2020 [employer]. Carbohydrate Counting For People With Diabetes  Foods with carbohydrates make your blood glucose level go up. Learning how to count carbohydrates can help you control your blood glucose levels. First, identify the foods you eat that contain carbohydrates. Then, using the Foods with Carbohydrates chart, determine about how much carbohydrates are in your meals and snacks. Make sure you are eating foods with fiber, protein, and healthy fat along with your carbohydrate foods. Foods with Carbohydrates The following table shows carbohydrate foods that have about 15 grams of carbohydrate each. Using measuring cups, spoons, or a food scale when you first begin learning about carbohydrate counting can help you learn about the portion sizes you typically eat. The following foods have 15 grams carbohydrate each:  Grains . 1 slice bread (1 ounce)  . 1 small tortilla (6-inch size)  .  large bagel (1 ounce)  . 1/3 cup pasta or rice (cooked)  .  hamburger or hot dog bun ( ounce)  .  cup cooked cereal  .  to  cup ready-to-eat cereal  . 2 taco shells (5-inch size) Fruit . 1 small fresh fruit ( to 1 cup)  .  medium banana  . 17 small grapes (3 ounces)  . 1 cup melon or berries  .  cup canned or frozen fruit  . 2 tablespoons dried fruit (blueberries, cherries, cranberries, raisins)  .  cup unsweetened fruit juice  Starchy Vegetables .  cup cooked beans, peas, corn, potatoes/sweet potatoes  .  large baked potato (3 ounces)  . 1 cup acorn or butternut squash  Snack Foods . 3 to 6 crackers  . 8 potato chips or 13 tortilla chips ( ounce to 1 ounce)  . 3 cups popped popcorn  Dairy . 3/4 cup (6 ounces) nonfat plain yogurt, or yogurt with sugar-free sweetener  . 1 cup milk  . 1 cup plain rice, soy, coconut or flavored almond milk Sweets and Desserts .  cup ice cream or frozen yogurt  . 1 tablespoon jam,  jelly, pancake syrup, table sugar, or honey  . 2 tablespoons light pancake syrup   . 1 inch square of frosted cake or 2 inch square of unfrosted cake  . 2 small cookies (2/3 ounce each) or  large cookie  Sometimes you'll have to estimate carbohydrate amounts if you don't know the exact recipe. One cup of mixed foods like soups can have 1 to 2 carbohydrate servings, while some casseroles might have 2 or more servings of carbohydrate. Foods that have less than 20 calories in each serving can be counted as "free" foods. Count 1 cup raw vegetables, or  cup cooked non-starchy vegetables as "free" foods. If you eat 3 or more servings at one meal, then count them as 1 carbohydrate serving.  Foods without Carbohydrates  Not all foods contain carbohydrates. Meat, some dairy, fats, non-starchy vegetables, and many beverages don't contain carbohydrate. So when you count carbohydrates, you can generally exclude chicken, pork, beef, fish, seafood, eggs, tofu, cheese, butter, sour cream, avocado, nuts, seeds, olives, mayonnaise, water, black coffee, unsweetened tea, and zero-calorie drinks. Vegetables with no or low carbohydrate include green beans, cauliflower, tomatoes, and onions. How much carbohydrate should I eat at each meal?  Carbohydrate counting can help you plan your meals and manage your weight. Following are some starting points for carbohydrate intake at each meal. Work with your registered dietitian nutritionist to find the best range that works for your blood glucose and weight.   To Lose Weight To Maintain Weight  Women 2 - 3 carb servings 3 - 4 carb servings  Men 3 - 4 carb servings 4 - 5 carb servings  Checking your blood glucose after meals will help you know if you need to adjust the timing, type, or number of carbohydrate servings in your meal plan. Achieve and keep a healthy body weight by balancing your food intake and physical activity.  Tips How should I plan my meals?  Plan for half the food on your plate to include non-starchy vegetables, like salad greens,  broccoli, or carrots. Try to eat 3 to 5 servings of non-starchy vegetables every day. Have a protein food at each meal. Protein foods include chicken, fish, meat, eggs, or beans (note that beans contain carbohydrate). These two food groups (non-starchy vegetables and proteins) are low in carbohydrate. If you fill up your plate with these foods, you will eat less carbohydrate but still fill up your stomach. Try to limit your carbohydrate portion to  of the plate.  What fats are healthiest to eat?  Diabetes increases risk for heart disease. To help protect your heart, eat more healthy fats, such as olive oil, nuts, and avocado. Eat less saturated fats like butter, cream, and high-fat meats, like bacon and sausage. Avoid trans fats, which are in all foods that list "partially hydrogenated oil" as an ingredient. What should I drink?  Choose drinks that are not sweetened with sugar. The healthiest choices are water, carbonated or seltzer waters, and tea and coffee without added sugars.  Sweet drinks will make your blood glucose go up very quickly. One serving of soda or energy drink is  cup. It is best to drink these beverages only if your blood glucose is low.  Artificially sweetened, or diet drinks, typically do not increase your blood glucose if they have zero calories in them. Read labels of beverages, as some diet drinks do have carbohydrate and will raise your blood glucose. Label Reading Tips Read Nutrition Facts labels to find out  how many grams of carbohydrate are in a food you want to eat. Don't forget: sometimes serving sizes on the label aren't the same as how much food you are going to eat, so you may need to calculate how much carbohydrate is in the food you are serving yourself.   Carbohydrate Counting for People with Diabetes Sample 1-Day Menu  Breakfast  cup yogurt, low fat, low sugar (1 carbohydrate serving)   cup cereal, ready-to-eat, unsweetened (1 carbohydrate serving)  1 cup  strawberries (1 carbohydrate serving)   cup almonds ( carbohydrate serving)  Lunch 1, 5 ounce can chunk light tuna  2 ounces cheese, low fat cheddar  6 whole wheat crackers (1 carbohydrate serving)  1 small apple (1 carbohydrate servings)   cup carrots ( carbohydrate serving)   cup snap peas  1 cup 1% milk (1 carbohydrate serving)   Evening Meal Stir fry made with: 3 ounces chicken  1 cup brown rice (3 carbohydrate servings)   cup broccoli ( carbohydrate serving)   cup green beans   cup onions  1 tablespoon olive oil  2 tablespoons teriyaki sauce ( carbohydrate serving)  Evening Snack 1 extra small banana (1 carbohydrate serving)  1 tablespoon peanut butter   Carbohydrate Counting for People with Diabetes Vegan Sample 1-Day Menu  Breakfast 1 cup cooked oatmeal (2 carbohydrate servings)   cup blueberries (1 carbohydrate serving)  2 tablespoons flaxseeds  1 cup soymilk fortified with calcium and vitamin D  1 cup coffee  Lunch 2 slices whole wheat bread (2 carbohydrate servings)   cup baked tofu   cup lettuce  2 slices tomato  2 slices avocado   cup baby carrots ( carbohydrate serving)  1 orange (1 carbohydrate serving)  1 cup soymilk fortified with calcium and vitamin D   Evening Meal Burrito made with: 1 6-inch corn tortilla (1 carbohydrate serving)  1 cup refried vegetarian beans (2 carbohydrate servings)   cup chopped tomatoes   cup lettuce   cup salsa  1/3 cup brown rice (1 carbohydrate serving)  1 tablespoon olive oil for rice   cup zucchini   Evening Snack 6 small whole grain crackers (1 carbohydrate serving)  2 apricots ( carbohydrate serving)   cup unsalted peanuts ( carbohydrate serving)    Carbohydrate Counting for People with Diabetes Vegetarian (Lacto-Ovo) Sample 1-Day Menu  Breakfast 1 cup cooked oatmeal (2 carbohydrate servings)   cup blueberries (1 carbohydrate serving)  2 tablespoons flaxseeds  1 egg  1 cup 1% milk (1  carbohydrate serving)  1 cup coffee  Lunch 2 slices whole wheat bread (2 carbohydrate servings)  2 ounces low-fat cheese   cup lettuce  2 slices tomato  2 slices avocado   cup baby carrots ( carbohydrate serving)  1 orange (1 carbohydrate serving)  1 cup unsweetened tea  Evening Meal Burrito made with: 1 6-inch corn tortilla (1 carbohydrate serving)   cup refried vegetarian beans (1 carbohydrate serving)   cup tomatoes   cup lettuce   cup salsa  1/3 cup brown rice (1 carbohydrate serving)  1 tablespoon olive oil for rice   cup zucchini  1 cup 1% milk (1 carbohydrate serving)  Evening Snack 6 small whole grain crackers (1 carbohydrate serving)  2 apricots ( carbohydrate serving)   cup unsalted peanuts ( carbohydrate serving)    Copyright 2020  Academy of [employer] and Dietetics. All rights reserved.  Using Nutrition Labels: Carbohydrate  . Serving Size  . Look at the serving  size. All the information on the label is based on this portion. Jolyne Loa Per Container  . The number of servings contained in the package. . Guidelines for Carbohydrate  . Look at the total grams of carbohydrate in the serving size.  . 1 carbohydrate choice = 15 grams of carbohydrate. Range of Carbohydrate Grams Per Choice  Carbohydrate Grams/Choice Carbohydrate Choices  6-10   11-20 1  21-25 1  26-35 2  36-40 2  41-50 3  51-55 3  56-65 4  66-70 4  71-80 5    Copyright 2020  Academy of Nutrition and Dietetics. All rights reserved.

## 2020-07-22 NOTE — Progress Notes (Signed)
Nutrition Follow-up  DOCUMENTATION CODES:   Morbid obesity  INTERVENTION:  - continue 30 ml Prosource Plus BID. - continue [employer] 1.4 po once/dya. - DM diet education handout placed in Discharge Instructions on 8/20   NUTRITION DIAGNOSIS:   Increased nutrient needs related to acute illness, catabolic illness (ZBCAF-68 infection) as evidenced by estimated needs -ongoing  GOAL:   Patient will meet greater than or equal to 90% of their needs -met  MONITOR:   PO intake, Supplement acceptance, Labs, Weight trends  ASSESSMENT:   59 y.o. male with medical history of HTN, DM, and HLD. He was discharged on 07/08/20 following treatment for COVID-19. Patient presented to the ED d/t progressive weakness and hypoxia while at outpatient infusion center on 8/14.  He has been eating 100% of meals on average over the past 1 week. He has not been weighed since admission on 8/15.   Discharge order for d/c home entered earlier this afternoon; discharge summary has not yet been entered.    Labs reviewed;  CBGs: 117 and 211 mg/dl. Medications reviewed; sliding scale novolog, 10 units novolog TID, 18 units levemir BID, 5 mg tradjenta/day, 1 tablet multivitamin with minerals/day, prednisone taper.    Diet Order:   Diet Order            Diet - low sodium heart healthy           Diet Carb Modified Fluid consistency: Thin; Room service appropriate? Yes  Diet effective now                 EDUCATION NEEDS:   Education needs have been addressed  Skin:  Skin Assessment: Reviewed RN Assessment  Last BM:  8/16  Height:   Ht Readings from Last 1 Encounters:  07/11/20 5' 7"  (1.702 m)    Weight:   Wt Readings from Last 1 Encounters:  07/11/20 119.7 kg     Estimated Nutritional Needs:  Kcal:  2000-2200 kcal Protein:  100-110 grams Fluid:  >/= 2 L/day      Jarome Matin, MS, RD, LDN, CNSC Inpatient Clinical Dietitian RD pager # available in [facility]  After hours/weekend  pager # available in [facility]

## 2020-07-22 NOTE — Progress Notes (Signed)
Pt discharged to home, reviewed d/c papers with pt and spouse, pt has orders for Insulin Pen, start to review teaching with pt, called md after discussion with pt, pt want to follow up with primary care MD first concerning insulin injections. Pt has never taken injections and have questions he want to address with primary care provider. Pt is aware of the side effect of the Prednisone... increase blood glucose. Pt acknowledged understanding and stated he will manage with diet and his current medications until he follow up with his PCP. SRP, RN

## 2020-07-22 NOTE — Progress Notes (Signed)
Patient Oxygen Saturations on 4 liters at Rest  93%  Patient Oxygen Saturations on 4 Liters while Ambulating 89%   Covid pt O2 sat decreased while walking to a low of 86% on 4 liters, denies dyspnea on exertion, however, states feel slightly fatigue. Pt recovers quickly to 91-92% on 4 liters.   SRP, RN

## 2020-07-22 NOTE — TOC Transition Note (Signed)
Transition of Care [facility]) - CM/SW Discharge Note   Patient Details  Name: Eulan Heyward MRN: [id number] Date of Birth: [birth year]  Transition of Care [facility] CM/SW Contact:  Darleene Cleaver, LCSW Phone Number: 07/22/2020, 4:01 PM   Clinical Narrative:     CSW was informed that patient qualified for oxygen.  CSW contacted [employer] to arrange for oxygen to be delivered to patient before he discharges home today.  [employer] is aware that patient is discharging today.  CSW made referral, patient will have oxygen delivered before he leaves.  Patient does not have any other needs, CSW signing off for now.   Final next level of care: Home/Self Care Barriers to Discharge: Barriers Resolved   Patient Goals and CMS Choice Patient states their goals for this hospitalization and ongoing recovery are:: To return back home with oxygen. CMS Medicare.gov Compare Post Acute Care list provided to:: Patient Choice offered to / list presented to : Patient  Discharge Placement  Patient discharging home with oxygen and family.                     Discharge Plan and Services                DME Arranged: Oxygen DME Agency: [employer] Date DME Agency Contacted: 07/22/20 Time DME Agency Contacted: 1600 Representative spoke with at DME Agency: Beryl Meager Agency: NA        Social Determinants of Health (SDOH) Interventions     Readmission Risk Interventions No flowsheet data found.

## 2020-07-22 NOTE — Discharge Summary (Signed)
Physician Discharge Summary  Jake Mosley IRS:[id number] DOB: December 24, 1960 DOA: 07/10/2020  PCP: Derrill Center., MD  Admit date: 07/10/2020 Discharge date: 07/22/2020  Admitted From: Home Disposition: Home  Recommendations for Outpatient Follow-up:  1. Follow up with PCP in 1-2 weeks 2. Please obtain BMP/CBC in one week 3. Please follow up on the following pending results:  Home Health: None Equipment/Devices: Oxygen  Discharge Condition: Stable CODE STATUS: Full Diet recommendation: Diabetic diet, advance as tolerated  Brief/Interim Summary: Jake Mosley is a 59 y.o. male with a history of T2DM, HTN, HLD, obesity who had been admitted 8/11-8/12 for covid-19 pneumonia, discharged with outpatient remdesivir infusions scheduled, and was found to be hypoxic at infusion center on 8/14. 2L O2 was given and patient sent to ED, subsequently admitted.  Patient met as above with acute hypoxic respiratory failure in setting of COVID-19 pneumonia.  Patient had prolonged hospitalization with slowly improving oxygen sats over the past 2 weeks.  At this time patient has completed his Remdesivir course, previously required Lasix but is euvolemic at this point.  Patient was initially on baricitinib but this was held due to questionable dental caries and abscess now on Augmentin.  His respiratory status continues to improve daily, patient is now able to ambulate without dyspnea although still somewhat hypoxic requiring 4 L nasal cannula to maintain sats with ambulation as well as at rest he does continue to improve as he was previously on upwards of 15 L nasal cannula with nonrebreather overlying to maintain sats at rest.  Patient will need close follow-up with PCP in the next 1 to 2 weeks to continue to wean his oxygen and to hopefully come off of oxygen entirely in the next few weeks.  Also need close follow-up given his apparently recurrent dental caries with concern for infection.  He will be discharged  on Augmentin as below for an additional 6 days.  Lengthy discussion with patient and wife about ongoing need for quarantining, patient will be to obtain his vaccine for COVID-19 after quarantine and once his symptoms have resolved.  Would recommend close follow-up with PCP for further instructions in the next 1 to 2 weeks for further discussion on this matter.  Patient otherwise stable and agreeable for discharge home.  Discharge Diagnoses:  Active Problems:   Pneumonia due to COVID-19 virus   Essential hypertension   Acute respiratory failure with hypoxia (HCC)   Type 2 diabetes mellitus without complication [facility])    Discharge Instructions  Discharge Instructions    Call MD for:  difficulty breathing, headache or visual disturbances   Complete by: As directed    Call MD for:  extreme fatigue   Complete by: As directed    Call MD for:  persistant dizziness or light-headedness   Complete by: As directed    Call MD for:  temperature >100.4   Complete by: As directed    Diet - low sodium heart healthy   Complete by: As directed    Increase activity slowly   Complete by: As directed      Allergies as of 07/22/2020   No Known Allergies     Medication List    STOP taking these medications   lisinopril 40 MG tablet Commonly known as: ZESTRIL     TAKE these medications   amoxicillin-clavulanate 875-125 MG tablet Commonly known as: AUGMENTIN Take 1 tablet by mouth every 12 (twelve) hours for 6 days.   aspirin 81 MG EC tablet Take 81 mg by mouth  daily.   atorvastatin 80 MG tablet Commonly known as: LIPITOR Take 80 mg by mouth daily.   insulin glargine 100 UNIT/ML Solostar Pen Commonly known as: LANTUS Inject 20 Units into the skin 2 (two) times daily.   Jardiance 25 MG Tabs tablet Generic drug: empagliflozin Take 25 mg by mouth daily.   metFORMIN 500 MG 24 hr tablet Commonly known as: GLUCOPHAGE-XR Take 1,000 mg by mouth 2 (two) times daily.   metoprolol succinate  100 MG 24 hr tablet Commonly known as: TOPROL-XL Take 100 mg by mouth daily.   predniSONE 20 MG tablet Commonly known as: DELTASONE Take 1 tablet (20 mg total) by mouth 2 (two) times daily with a meal. What changed: You were already taking a medication with the same name, and this prescription was added. Make sure you understand how and when to take each.   predniSONE 10 MG tablet Commonly known as: DELTASONE Take 1 tablet (10 mg total) by mouth 2 (two) times daily with a meal. Start taking on: July 24, 2020 What changed:   See the new instructions.  These instructions start on July 24, 2020. If you are unsure what to do until then, ask your doctor or other care provider.   predniSONE 10 MG tablet Commonly known as: DELTASONE Take 1 tablet (10 mg total) by mouth daily with breakfast. Start taking on: July 27, 2020 What changed: You were already taking a medication with the same name, and this prescription was added. Make sure you understand how and when to take each.   sildenafil 20 MG tablet Commonly known as: REVATIO Take 20-60 mg by mouth daily as needed (erectile dysfunction).            Durable Medical Equipment  (From admission, onward)         Start     Ordered   07/22/20 1300  DME Oxygen  Once       Question Answer Comment  Length of Need 6 Months   Mode or (Route) Nasal cannula   Liters per Minute 4   Frequency Continuous (stationary and portable oxygen unit needed)   Oxygen conserving device No   Oxygen delivery system Gas      07/22/20 1301          No Known Allergies  Consultations: None  Procedures/Studies: CT Angio Chest PE W and/or Wo Contrast  Result Date: 07/07/2020 CLINICAL DATA:  Syncopal episode with flu like symptoms EXAM: CT ANGIOGRAPHY CHEST WITH CONTRAST TECHNIQUE: Multidetector CT imaging of the chest was performed using the standard protocol during bolus administration of intravenous contrast. Multiplanar CT image  reconstructions and MIPs were obtained to evaluate the vascular anatomy. CONTRAST:  162m OMNIPAQUE IOHEXOL 350 MG/ML SOLN COMPARISON:  Chest x-ray 07/07/2020 FINDINGS: Cardiovascular: Suboptimal opacification of the pulmonary arteries to the segmental level. Limited evaluation for distal segmental and subsegmental pulmonary emboli secondary to respiratory motion artifact as well as limited contrast opacification. No definite acute central embolus is seen. The aorta is nonaneurysmal. Coronary vascular calcification. Heart size within normal limits. No pericardial effusion Mediastinum/Nodes: Midline trachea. No thyroid mass. No suspicious adenopathy. Esophagus within normal limits. Lungs/Pleura: Moderate bilateral patchy airspace consolidations and ground-glass densities throughout the lungs. No pleural effusion or pneumothorax. Upper Abdomen: No acute abnormality. Musculoskeletal: No chest wall abnormality. No acute or significant osseous findings. Review of the MIP images confirms the above findings. IMPRESSION: 1. Limited study secondary to suboptimal opacification of the pulmonary arterial system. No definite acute central embolus  is seen. 2. Moderate bilateral patchy airspace consolidations and ground-glass densities throughout the lungs, felt suspicious for multifocal pneumonia. Imaging features can be seen with COVID-19 pneumonia, though are nonspecific and can occur with a variety of infectious and noninfectious processes. Electronically Signed   By: Donavan Foil M.D.   On: 07/07/2020 20:18   DG Chest Port 1 View  Result Date: 07/12/2020 CLINICAL DATA:  Hypoxia EXAM: PORTABLE CHEST 1 VIEW COMPARISON:  07/10/2020 chest radiograph and prior. FINDINGS: Decreased conspicuity of multifocal bilateral patchy pulmonary opacities. No pneumothorax or pleural effusion. No new focal consolidation. Cardiomediastinal silhouette within normal limits. No acute osseous abnormality. IMPRESSION: Multifocal patchy  opacities, less conspicuous than prior exam. Electronically Signed   By: Primitivo Gauze M.D.   On: 07/12/2020 11:50   DG Chest Port 1 View  Result Date: 07/10/2020 CLINICAL DATA:  COVID positive EXAM: PORTABLE CHEST 1 VIEW COMPARISON:  Chest radiograph dated 07/07/2020. FINDINGS: The heart size and mediastinal contours are within normal limits. Moderate patchy bilateral airspace opacities have increased since 07/07/2020. No pleural effusion or pneumothorax. The visualized skeletal structures are unremarkable. IMPRESSION: Increased moderate patchy bilateral airspace opacities consistent with COVID-19 pneumonia. Electronically Signed   By: Zerita Boers M.D.   On: 07/10/2020 15:06   DG Chest Port 1 View  Result Date: 07/07/2020 CLINICAL DATA:  Sepsis.  Flu-like symptoms last week. EXAM: PORTABLE CHEST 1 VIEW COMPARISON:  Chest x-ray dated November 09, 2009. FINDINGS: The heart size and mediastinal contours are within normal limits. Normal pulmonary vascularity. Mild patchy peripheral asymmetric interstitial and airspace opacities in the right mid and both lower lungs. No pleural effusion or pneumothorax. No acute osseous abnormality. IMPRESSION: 1. Findings concerning for atypical viral pneumonia. Electronically Signed   By: Titus Dubin M.D.   On: 07/07/2020 17:32   ECHOCARDIOGRAM LIMITED  Result Date: 07/08/2020    ECHOCARDIOGRAM LIMITED REPORT   Patient Name:   Jake Mosley Date of Exam: 07/08/2020 Medical Rec #:  [id number]       Height:       67.0 in Accession #:    [id number]      Weight:       259.4 lb Date of Birth:  [birth year]       BSA:          2.259 m Patient Age:    88 years        BP:           122/69 mmHg Patient Gender: M               HR:           60 bpm. Exam Location:  Inpatient Procedure: Limited Echo, Limited Color Doppler and Cardiac Doppler Indications:    Syncope R55  History:        Patient has no prior history of Echocardiogram examinations.                 Covid-19  Positive; Risk Factors:Hypertension and Diabetes.  Sonographer:    Mikki Santee RDCS (AE) Referring Phys: [id number] RAVI PAHWANI IMPRESSIONS  1. Left ventricular ejection fraction, by estimation, is 60 to 65%. The left ventricle has normal function.  2. Right ventricular systolic function is normal. The right ventricular size is normal.  3. The mitral valve is normal in structure. No evidence of mitral valve regurgitation.  4. The aortic valve is normal in structure. Aortic valve regurgitation is not visualized. FINDINGS  Left Ventricle: Left ventricular ejection fraction,  by estimation, is 60 to 65%. The left ventricle has normal function. Right Ventricle: The right ventricular size is normal. No increase in right ventricular wall thickness. Right ventricular systolic function is normal. Left Atrium: Left atrial size was normal in size. Right Atrium: Right atrial size was normal in size. Mitral Valve: The mitral valve is normal in structure. Tricuspid Valve: The tricuspid valve is normal in structure. Tricuspid valve regurgitation is not demonstrated. Aortic Valve: The aortic valve is normal in structure. Aortic valve regurgitation is not visualized. IAS/Shunts: The atrial septum is grossly normal. Mertie Moores MD Electronically signed by Mertie Moores MD Signature Date/Time: 07/08/2020/3:55:01 PM    Final      Subjective: No acute issues or events overnight, patient's dyspnea is essentially resolving even with minimal exertion, he does remain somewhat hypoxic with exertion as above requiring 4 L nasal cannula but otherwise denies chest pain, nausea, vomiting, diarrhea, constipation, headache, fevers, chills.   Discharge Exam: Vitals:   07/22/20 0916 07/22/20 1138  BP:  119/74  Pulse: 73 67  Resp:  (!) 22  Temp:    SpO2: 91% 92%   Vitals:   07/22/20 0622 07/22/20 0854 07/22/20 0916 07/22/20 1138  BP: 101/73   119/74  Pulse: 64 64 73 67  Resp: (!) 21   (!) 22  Temp: 98.1 F (36.7 C)      TempSrc: Oral     SpO2: 91% 94% 91% 92%  Weight:      Height:        General: Pt is alert, awake, not in acute distress Cardiovascular: RRR, S1/S2 +, no rubs, no gallops Respiratory: CTA bilaterally, no wheezing, no rhonchi Abdominal: Soft, NT, ND, bowel sounds + Extremities: no edema, no cyanosis    The results of significant diagnostics from this hospitalization (including imaging, microbiology, ancillary and laboratory) are listed below for reference.     Microbiology: No results found for this or any previous visit (from the past 240 hour(s)).   Labs: BNP (last 3 results) No results for input(s): BNP in the last 8760 hours. Basic Metabolic Panel: Recent Labs  Lab 07/17/20 0445  NA 139  K 4.9  CL 101  CO2 29  GLUCOSE 182*  BUN 40*  CREATININE 1.04  CALCIUM 8.4*   Liver Function Tests: No results for input(s): AST, ALT, ALKPHOS, BILITOT, PROT, ALBUMIN in the last 168 hours. No results for input(s): LIPASE, AMYLASE in the last 168 hours. No results for input(s): AMMONIA in the last 168 hours. CBC: No results for input(s): WBC, NEUTROABS, HGB, HCT, MCV, PLT in the last 168 hours. Cardiac Enzymes: No results for input(s): CKTOTAL, CKMB, CKMBINDEX, TROPONINI in the last 168 hours. BNP: Invalid input(s): POCBNP CBG: Recent Labs  Lab 07/21/20 1150 07/21/20 1719 07/21/20 2132 07/22/20 0825 07/22/20 1113  GLUCAP 120* 194* 276* 117* 211*   D-Dimer No results for input(s): DDIMER in the last 72 hours. Hgb A1c No results for input(s): HGBA1C in the last 72 hours. Lipid Profile No results for input(s): CHOL, HDL, LDLCALC, TRIG, CHOLHDL, LDLDIRECT in the last 72 hours. Thyroid function studies No results for input(s): TSH, T4TOTAL, T3FREE, THYROIDAB in the last 72 hours.  Invalid input(s): FREET3 Anemia work up No results for input(s): VITAMINB12, FOLATE, FERRITIN, TIBC, IRON, RETICCTPCT in the last 72 hours. Urinalysis    Component Value Date/Time    COLORURINE YELLOW 07/08/2020 0217   APPEARANCEUR CLEAR 07/08/2020 0217   LABSPEC 1.033 (H) 07/08/2020 0217   PHURINE 5.0 07/08/2020  0217   GLUCOSEU >=500 (A) 07/08/2020 0217   HGBUR NEGATIVE 07/08/2020 0217   BILIRUBINUR NEGATIVE 07/08/2020 0217   KETONESUR NEGATIVE 07/08/2020 0217   PROTEINUR NEGATIVE 07/08/2020 0217   NITRITE NEGATIVE 07/08/2020 0217   LEUKOCYTESUR NEGATIVE 07/08/2020 0217   Sepsis Labs Invalid input(s): PROCALCITONIN,  WBC,  LACTICIDVEN Microbiology No results found for this or any previous visit (from the past 240 hour(s)).   Time coordinating discharge: Over 30 minutes  SIGNED:   Little Ishikawa, DO [facility] 07/22/2020, 1:59 PM Pager   If 7PM-7AM, please contact night-coverage [email or web address]

## 2020-11-08 IMAGING — CT CT ANGIO CHEST
2 of 8 series · 19 of 36 positions shown · IV contrast (Omnipaque)
Comparison: Chest x-ray 07/07/2020

CLINICAL DATA: Syncopal episode with flu like symptoms

EXAM:
CT ANGIOGRAPHY CHEST WITH CONTRAST
TECHNIQUE: Multidetector CT imaging of the chest was performed using the
standard protocol during bolus administration of intravenous
contrast. Multiplanar CT image reconstructions and MIPs were
obtained to evaluate the vascular anatomy.
CONTRAST:  100mL OMNIPAQUE IOHEXOL 350 MG/ML SOLN

[Series 6: pe coronal mpr · coronal · 0.59mm/px · 1 of 106 slices shown]
[im 53/106  mediastinal]
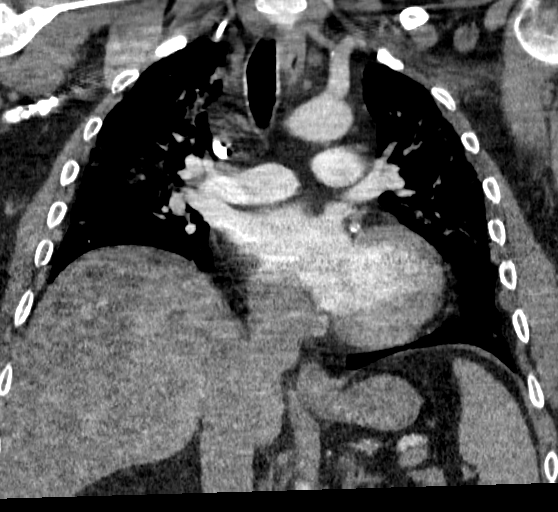

[Series 10: pe thins · axial · 0.77mm/px · z∈[-81,+189]mm · 18 of 402 slices shown]
[im 21/402  lung]
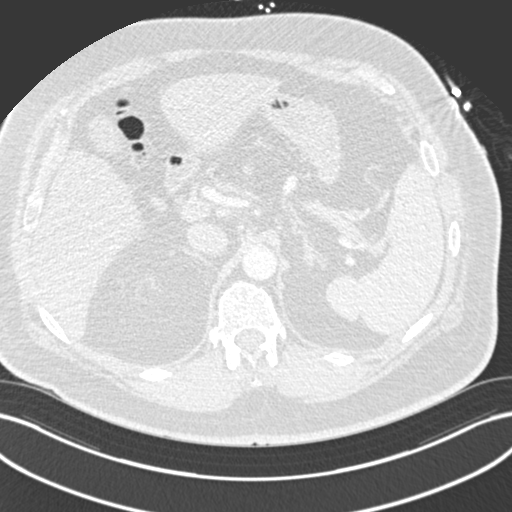
[im 41/402  mediastinal]
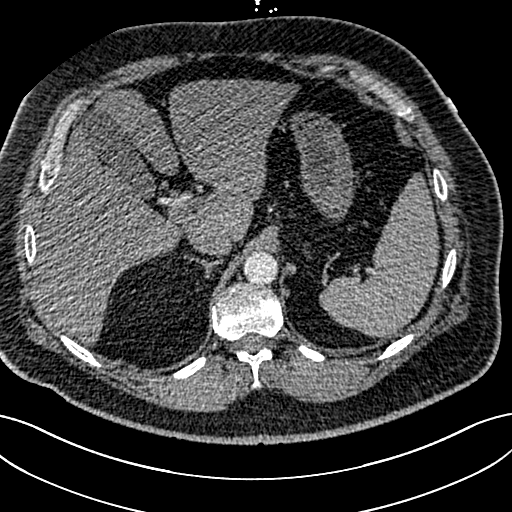
[im 61/402  lung]
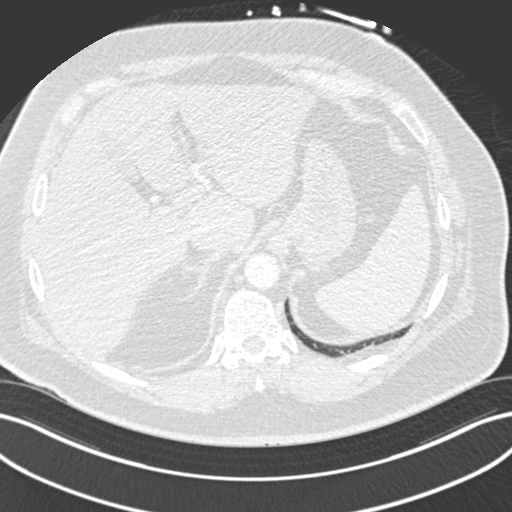
[im 81/402  mediastinal]
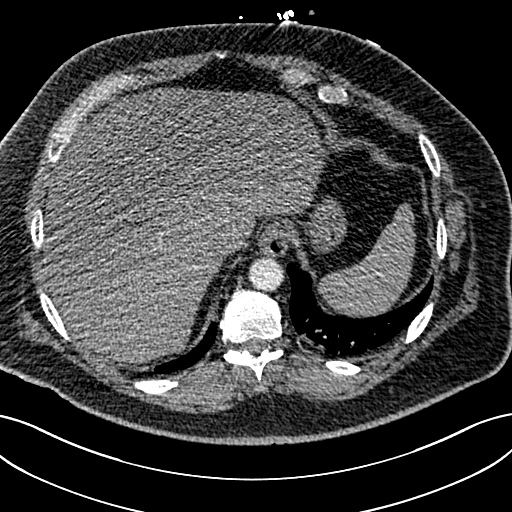
[im 101/402  lung]
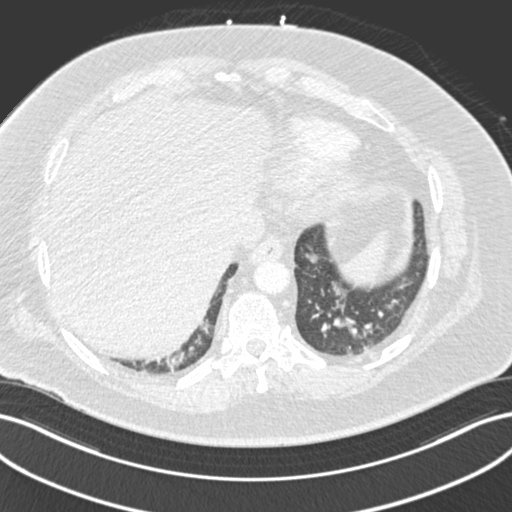
[im 121/402  mediastinal]
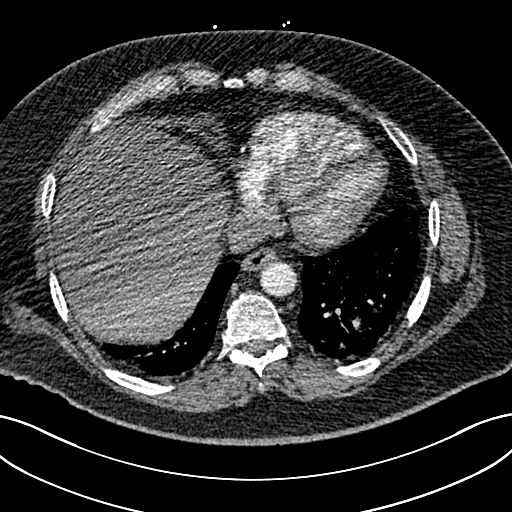
[im 141/402  lung]
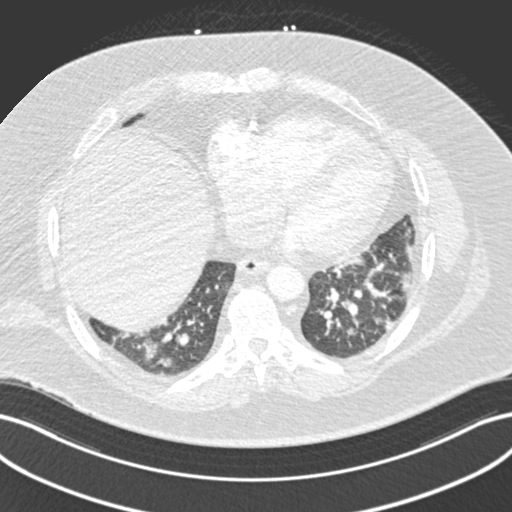
[im 161/402  mediastinal]
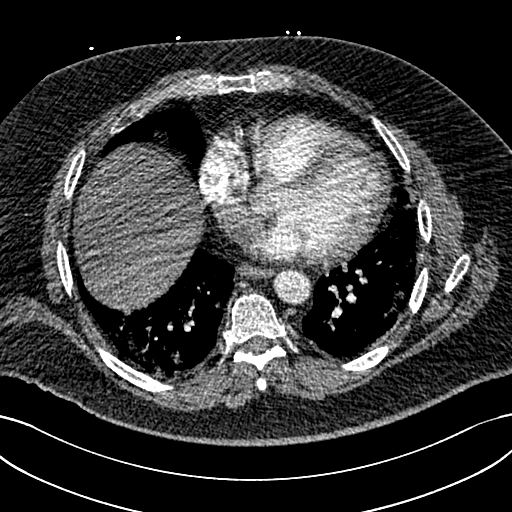
[im 181/402  lung]
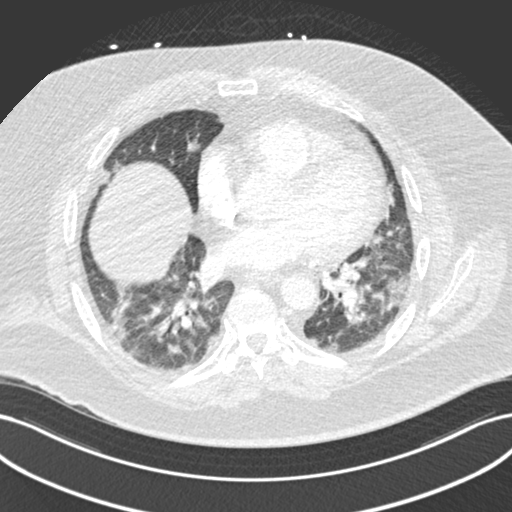
[im 221/402  mediastinal]
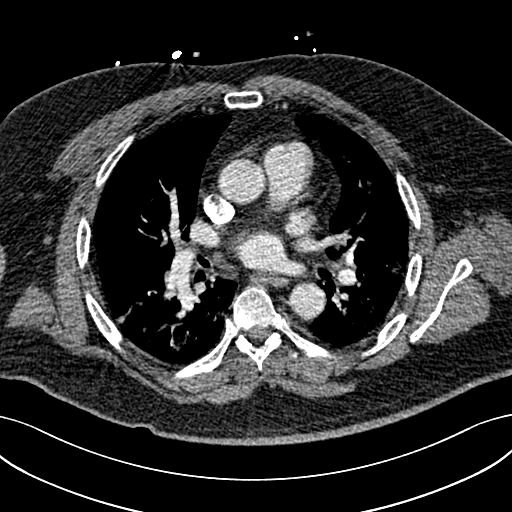
[im 241/402  lung]
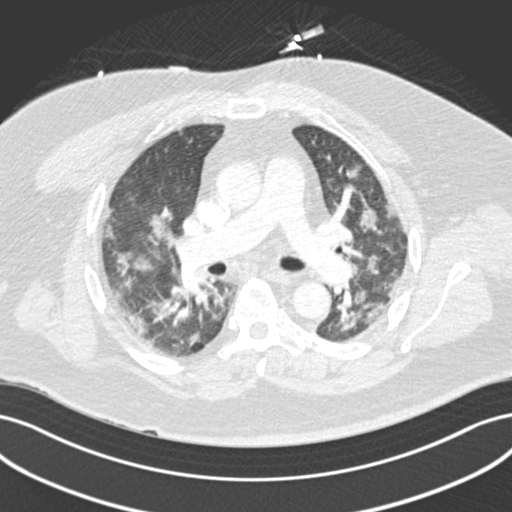
[im 261/402  mediastinal]
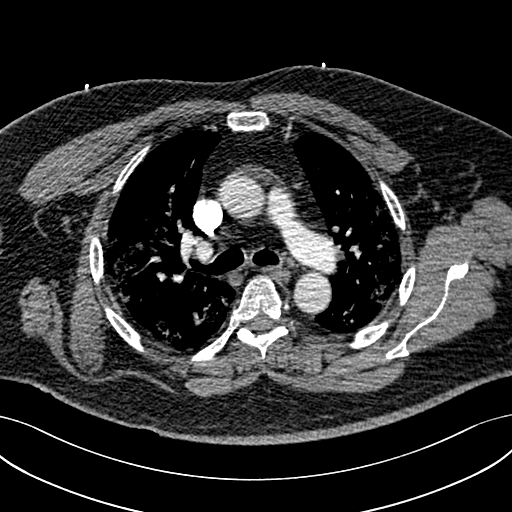
[im 281/402  lung]
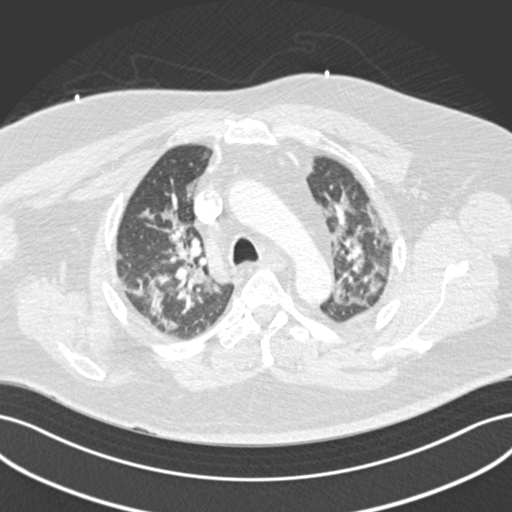
[im 301/402  mediastinal]
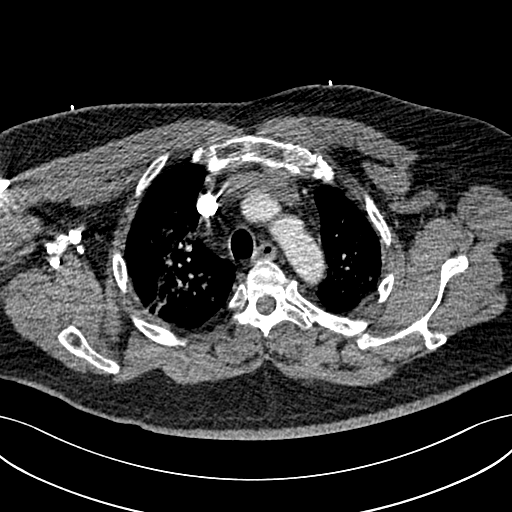
[im 321/402  lung]
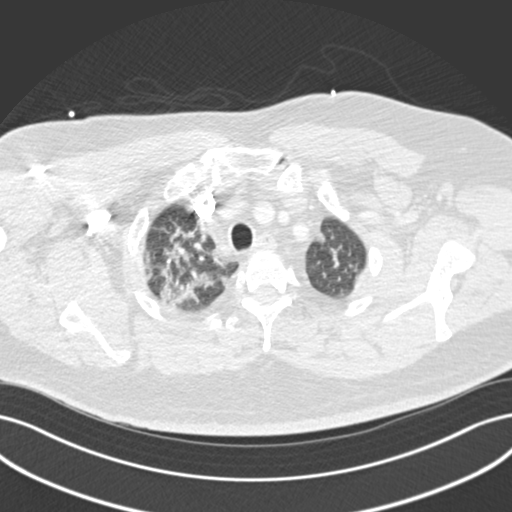
[im 341/402  mediastinal]
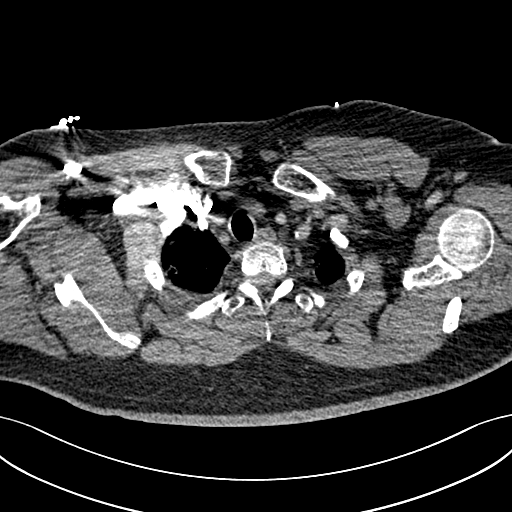
[im 361/402  lung]
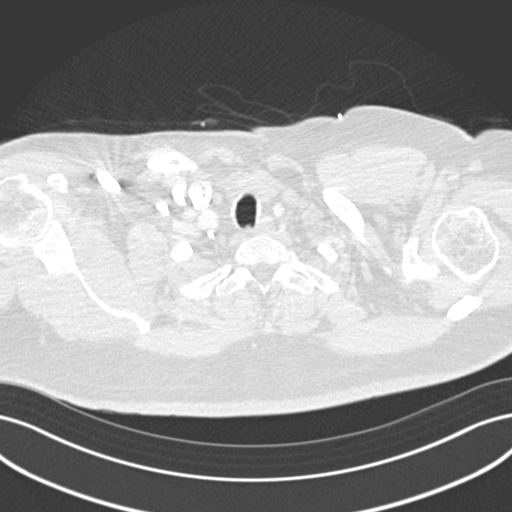
[im 381/402  mediastinal]
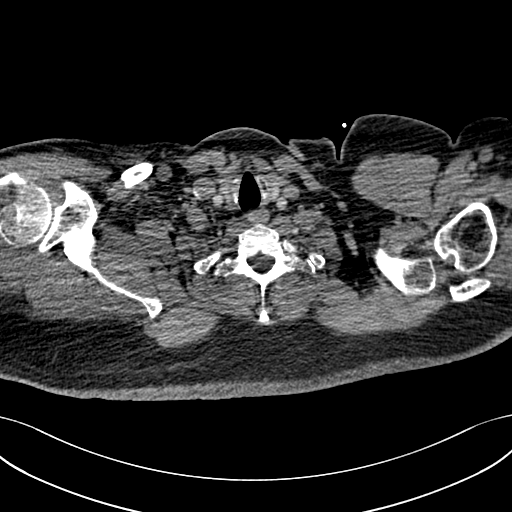

[19 of 36 positions shown; findings below may reference images not displayed]

FINDINGS: Cardiovascular: Suboptimal opacification of the pulmonary arteries
to the segmental level. Limited evaluation for distal segmental and
subsegmental pulmonary emboli secondary to respiratory motion
artifact as well as limited contrast opacification. No definite
acute central embolus is seen. The aorta is nonaneurysmal. Coronary
vascular calcification. Heart size within normal limits. No
pericardial effusion

Mediastinum/Nodes: Midline trachea. No thyroid mass. No suspicious
adenopathy. Esophagus within normal limits.

Lungs/Pleura: Moderate bilateral patchy airspace consolidations and
ground-glass densities throughout the lungs. No pleural effusion or
pneumothorax.

Upper Abdomen: No acute abnormality.

Musculoskeletal: No chest wall abnormality. No acute or significant
osseous findings.

Review of the MIP images confirms the above findings.
IMPRESSION: 1. Limited study secondary to suboptimal opacification of the
pulmonary arterial system. No definite acute central embolus is
seen.
2. Moderate bilateral patchy airspace consolidations and
ground-glass densities throughout the lungs, felt suspicious for
multifocal pneumonia. Imaging features can be seen with [id number]
pneumonia, though are nonspecific and can occur with a variety of
infectious and noninfectious processes.

## 2020-11-11 IMAGING — DX DG CHEST 1V PORT
1 series · 1 of 1 positions shown · non-contrast
Comparison: Chest radiograph dated 07/07/2020.

CLINICAL DATA: COVID positive

EXAM:
PORTABLE CHEST 1 VIEW

[chest ap]
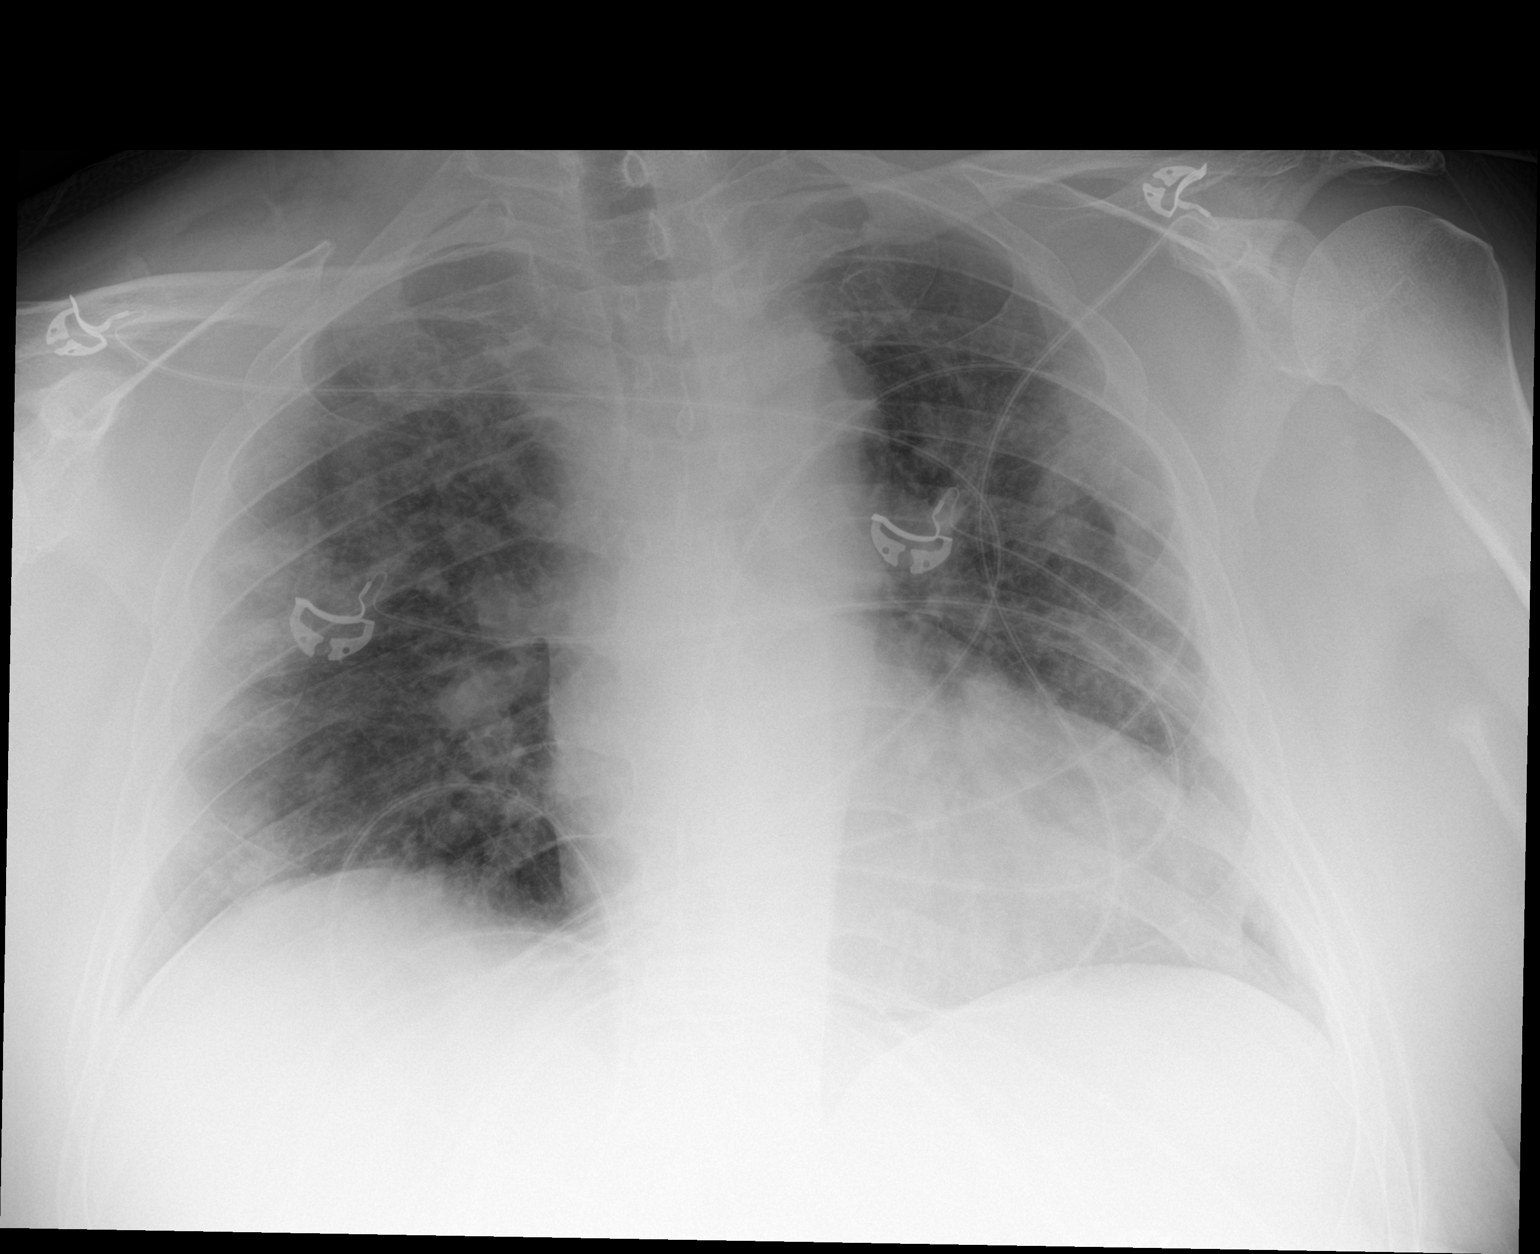

[1 of 1 positions shown; findings below may reference images not displayed]

FINDINGS: The heart size and mediastinal contours are within normal limits.
Moderate patchy bilateral airspace opacities have increased since
07/07/2020. No pleural effusion or pneumothorax. The visualized
skeletal structures are unremarkable.
IMPRESSION: Increased moderate patchy bilateral airspace opacities consistent
with [id number] pneumonia.

## 2020-11-13 IMAGING — DX DG CHEST 1V PORT
1 series · 1 of 1 positions shown · non-contrast
Comparison: 07/10/2020 chest radiograph and prior.

CLINICAL DATA: Hypoxia

EXAM:
PORTABLE CHEST 1 VIEW

[chest ap]
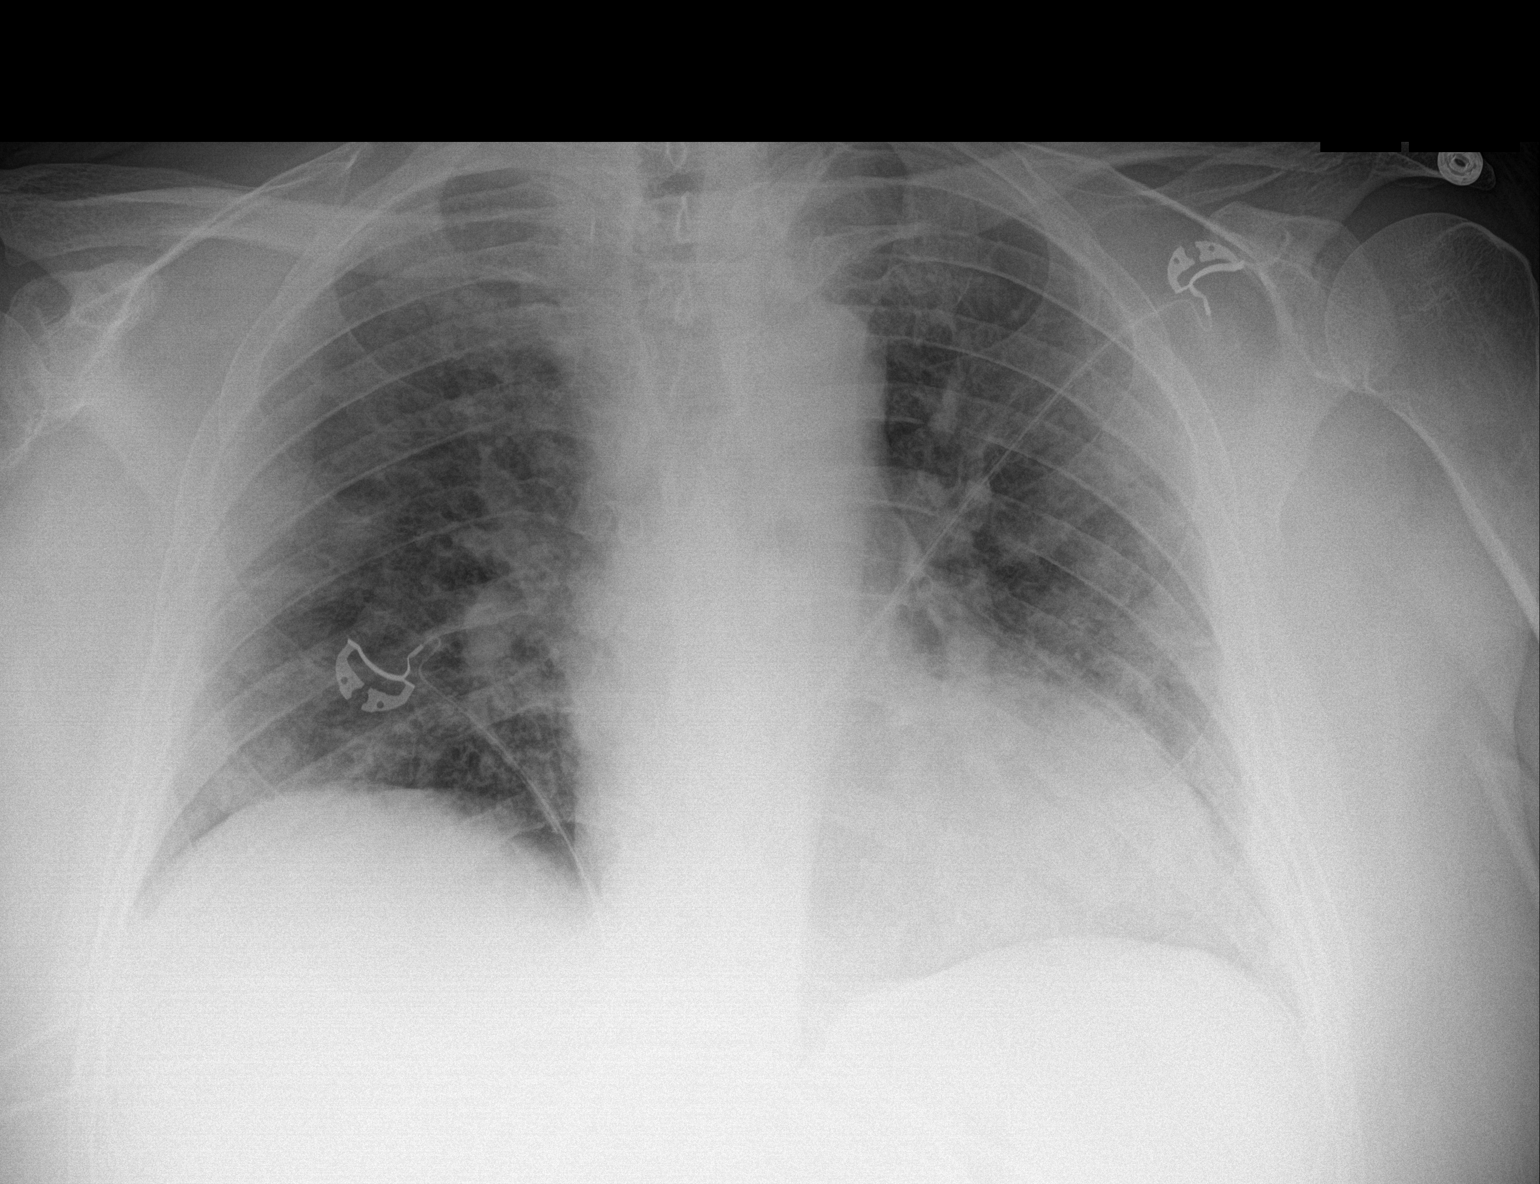

[1 of 1 positions shown; findings below may reference images not displayed]

FINDINGS: Decreased conspicuity of multifocal bilateral patchy pulmonary
opacities. No pneumothorax or pleural effusion. No new focal
consolidation. Cardiomediastinal silhouette within normal limits. No
acute osseous abnormality.
IMPRESSION: Multifocal patchy opacities, less conspicuous than prior exam.
